# Patient Record
Sex: Female | Born: 1970
Health system: Southern US, Community
[De-identification: ages and names within clinical notes are randomized; demographics above are authoritative.]

## PROBLEM LIST (undated history)

## (undated) DIAGNOSIS — E782 Mixed hyperlipidemia: Secondary | ICD-10-CM

## (undated) DIAGNOSIS — I10 Essential (primary) hypertension: Secondary | ICD-10-CM

## (undated) DIAGNOSIS — G4700 Insomnia, unspecified: Secondary | ICD-10-CM

## (undated) DIAGNOSIS — S42301A Unspecified fracture of shaft of humerus, right arm, initial encounter for closed fracture: Secondary | ICD-10-CM

## (undated) DIAGNOSIS — Z87440 Personal history of urinary (tract) infections: Secondary | ICD-10-CM

## (undated) DIAGNOSIS — K219 Gastro-esophageal reflux disease without esophagitis: Secondary | ICD-10-CM

## (undated) DIAGNOSIS — D518 Other vitamin B12 deficiency anemias: Secondary | ICD-10-CM

## (undated) DIAGNOSIS — F5102 Adjustment insomnia: Secondary | ICD-10-CM

## (undated) DIAGNOSIS — R7301 Impaired fasting glucose: Secondary | ICD-10-CM

## (undated) DIAGNOSIS — E669 Obesity, unspecified: Secondary | ICD-10-CM

## (undated) DIAGNOSIS — Z8719 Personal history of other diseases of the digestive system: Secondary | ICD-10-CM

## (undated) DIAGNOSIS — E559 Vitamin D deficiency, unspecified: Secondary | ICD-10-CM

## (undated) DIAGNOSIS — R32 Unspecified urinary incontinence: Secondary | ICD-10-CM

## (undated) DIAGNOSIS — M722 Plantar fascial fibromatosis: Secondary | ICD-10-CM

## (undated) DIAGNOSIS — E119 Type 2 diabetes mellitus without complications: Secondary | ICD-10-CM

## (undated) DIAGNOSIS — E781 Pure hyperglyceridemia: Secondary | ICD-10-CM

## (undated) HISTORY — DX: Mixed hyperlipidemia: E78.2

## (undated) HISTORY — DX: Type 2 diabetes mellitus without complications: E11.9

## (undated) HISTORY — PX: ABDOMINAL HYSTERECTOMY: SHX81

## (undated) HISTORY — PX: OTHER SURGICAL HISTORY: SHX169

## (undated) HISTORY — DX: Adjustment insomnia: F51.02

## (undated) HISTORY — PX: CHOLECYSTECTOMY: SHX55

---

## 2015-01-26 ENCOUNTER — Ambulatory Visit (INDEPENDENT_AMBULATORY_CARE_PROVIDER_SITE_OTHER): Payer: BLUE CROSS/BLUE SHIELD | Admitting: Podiatry

## 2015-01-26 ENCOUNTER — Encounter: Payer: Self-pay | Admitting: Podiatry

## 2015-01-26 VITALS — BP 105/75 | HR 83 | Temp 98.7°F | Resp 14

## 2015-01-26 DIAGNOSIS — L03031 Cellulitis of right toe: Secondary | ICD-10-CM

## 2015-01-26 DIAGNOSIS — B353 Tinea pedis: Secondary | ICD-10-CM

## 2015-01-26 MED ORDER — CLOTRIMAZOLE-BETAMETHASONE 1-0.05 % EX CREA: 1.0000 "application " | TOPICAL_CREAM | Freq: Two times a day (BID) | CUTANEOUS | Status: DC

## 2015-01-26 NOTE — Progress Notes (Signed)
   Subjective:    Patient ID: Madison Carey, female    DOB: Jan 12, 1971, 44 y.o.   MRN: [id number]  HPI Patient presents here today with  Right great toe pain for about 3 mos. She has tried soaking and had a nail salon to try to dig it out Also she has a left great toe black spot and a B/L bottom offeet rash, has gone to a Dermatologist and was diagnosed with some type of skin disease. This patient presents to the office with severe pain x 3 months along the inside border big toe right.  She says her salon and dermatologist have attempted to relieve her pain with no avail.  She says her sheets at home have caused severe pain in bed.  She also has skin rash on both feet on bottom of both feet including bottom of right big toe.  Review of Systems  All other systems reviewed and are negative.      Objective:   Physical Exam GENERAL APPEARANCE: Alert, conversant. Appropriately groomed. No acute distress.  VASCULAR: Pedal pulses palpable at  [facility] and PT bilateral.  Capillary refill time is immediate to all digits,  Normal temperature gradient.  Digital hair growth is present bilateral  NEUROLOGIC: sensation is normal to 5.07 monofilament at 5/5 sites bilateral.  Light touch is intact bilateral, Muscle strength normal.  MUSCULOSKELETAL: acceptable muscle strength, tone and stability bilateral.  Intrinsic muscluature intact bilateral.  Rectus appearance of foot and digits noted bilateral.   DERMATOLOGIC: skin color, texture, and turgor are within normal limits.  No preulcerative lesions or ulcers  are seen, no interdigital maceration noted.  No open lesions present.  . No drainage noted. Red inflamed patches of skin bottom of both feet with signs of blister noted. NAILS  Pain upon distal pressure medial border right great toe. No signs of redness or infection noted.          Assessment & Plan:  Paronychia medial border right hallux.  Tinea pedis B/L  IE  I & D paronychia medial border right  hallux.  Prescribed lotrisone for athletes foot.

## 2015-01-26 NOTE — Addendum Note (Signed)
Addended by: Ezzard Flax, Holley Wirt L on: 01/26/2015 03:32 PM   Modules accepted: Orders, Medications

## 2015-01-29 ENCOUNTER — Ambulatory Visit: Payer: BLUE CROSS/BLUE SHIELD | Admitting: Podiatry

## 2015-12-30 ENCOUNTER — Other Ambulatory Visit: Payer: Self-pay | Admitting: Urology

## 2016-02-11 ENCOUNTER — Encounter (HOSPITAL_COMMUNITY): Payer: Self-pay | Admitting: *Deleted

## 2016-02-11 ENCOUNTER — Encounter (HOSPITAL_COMMUNITY)
Admission: RE | Admit: 2016-02-11 | Discharge: 2016-02-11 | Disposition: A | Discharge status: Home / Self Care | Payer: BLUE CROSS/BLUE SHIELD | Source: Ambulatory Visit | Attending: Urology | Admitting: Urology

## 2016-02-11 DIAGNOSIS — T83722A Exposure of implanted urethral mesh into urethra, initial encounter: Secondary | ICD-10-CM | POA: Diagnosis not present

## 2016-02-11 DIAGNOSIS — X58XXXA Exposure to other specified factors, initial encounter: Secondary | ICD-10-CM | POA: Insufficient documentation

## 2016-02-11 DIAGNOSIS — Z01812 Encounter for preprocedural laboratory examination: Secondary | ICD-10-CM | POA: Diagnosis not present

## 2016-02-11 HISTORY — DX: Other vitamin B12 deficiency anemias: D51.8

## 2016-02-11 HISTORY — DX: Unspecified fracture of shaft of humerus, right arm, initial encounter for closed fracture: S42.301A

## 2016-02-11 HISTORY — DX: Gastro-esophageal reflux disease without esophagitis: K21.9

## 2016-02-11 HISTORY — DX: Personal history of urinary (tract) infections: Z87.440

## 2016-02-11 HISTORY — DX: Essential (primary) hypertension: I10

## 2016-02-11 HISTORY — DX: Plantar fascial fibromatosis: M72.2

## 2016-02-11 HISTORY — DX: Insomnia, unspecified: G47.00

## 2016-02-11 HISTORY — DX: Impaired fasting glucose: R73.01

## 2016-02-11 HISTORY — DX: Unspecified urinary incontinence: R32

## 2016-02-11 HISTORY — DX: Vitamin D deficiency, unspecified: E55.9

## 2016-02-11 HISTORY — DX: Pure hyperglyceridemia: E78.1

## 2016-02-11 HISTORY — DX: Obesity, unspecified: E66.9

## 2016-02-11 HISTORY — DX: Personal history of other diseases of the digestive system: Z87.19

## 2016-02-11 LAB — CBC
HCT: 35.2 % — ABNORMAL LOW (ref 36.0–46.0)
Hemoglobin: 12.4 g/dL (ref 12.0–15.0)
MCH: 31.9 pg (ref 26.0–34.0)
MCHC: 35.2 g/dL (ref 30.0–36.0)
MCV: 90.5 fL (ref 78.0–100.0)
PLATELETS: 275 10*3/uL (ref 150–400)
RBC: 3.89 MIL/uL (ref 3.87–5.11)
RDW: 14.6 % (ref 11.5–15.5)
WBC: 6 10*3/uL (ref 4.0–10.5)

## 2016-02-11 LAB — BASIC METABOLIC PANEL
Anion gap: 7 (ref 5–15)
BUN: 10 mg/dL (ref 6–20)
CALCIUM: 9.6 mg/dL (ref 8.9–10.3)
CO2: 24 mmol/L (ref 22–32)
CREATININE: 0.67 mg/dL (ref 0.44–1.00)
Chloride: 109 mmol/L (ref 101–111)
GLUCOSE: 102 mg/dL — AB (ref 65–99)
Potassium: 3.9 mmol/L (ref 3.5–5.1)
Sodium: 140 mmol/L (ref 135–145)

## 2016-02-11 NOTE — Patient Instructions (Signed)
Madison Carey  02/11/2016   Your procedure is scheduled on: Tuesday February 16, 2016  Report to [facility] Main  Entrance take [place]  elevators to 3rd floor to  [place] at 5:30 AM.  Call this number if you have problems the morning of surgery [phone]   Remember: ONLY 1 PERSON MAY GO WITH YOU TO SHORT STAY TO GET  READY MORNING OF [name].  Do not eat food or drink liquids :After Midnight.     Take these medicines the morning of surgery with A SIP OF WATER: Omeprazole;                                 You may not have any metal on your body including hair pins and              piercings  Do not wear jewelry, make-up, lotions, powders or perfumes, deodorant             Do not wear nail polish.  Do not shave  48 hours prior to surgery.     Do not bring valuables to the hospital. [place].  Contacts, dentures or bridgework may not be worn into surgery.  Leave suitcase in the car. After surgery it may be brought to your room. ____________________________________________________________________             [facility] - Preparing for Surgery Before surgery, you can play an important role.  Because skin is not sterile, your skin needs to be as free of germs as possible.  You can reduce the number of germs on your skin by washing with CHG (chlorahexidine gluconate) soap before surgery.  CHG is an antiseptic cleaner which kills germs and bonds with the skin to continue killing germs even after washing. Please DO NOT use if you have an allergy to CHG or antibacterial soaps.  If your skin becomes reddened/irritated stop using the CHG and inform your nurse when you arrive at Short Stay. Do not shave (including legs and underarms) for at least 48 hours prior to the first CHG shower.  You may shave your face/neck. Please follow these instructions carefully:  1.  Shower with CHG Soap the night before  surgery and the  morning of Surgery.  2.  If you choose to wash your hair, wash your hair first as usual with your  normal  shampoo.  3.  After you shampoo, rinse your hair and body thoroughly to remove the  shampoo.                           4.  Use CHG as you would any other liquid soap.  You can apply chg directly  to the skin and wash                       Gently with a scrungie or clean washcloth.  5.  Apply the CHG Soap to your body ONLY FROM THE NECK DOWN.   Do not use on face/ open  Wound or open sores. Avoid contact with eyes, ears mouth and genitals (private parts).                       Wash face,  Genitals (private parts) with your normal soap.             6.  Wash thoroughly, paying special attention to the area where your surgery  will be performed.  7.  Thoroughly rinse your body with warm water from the neck down.  8.  DO NOT shower/wash with your normal soap after using and rinsing off  the CHG Soap.                9.  Pat yourself dry with a clean towel.            10.  Wear clean pajamas.            11.  Place clean sheets on your bed the night of your first shower and do not  sleep with pets. Day of Surgery : Do not apply any lotions/deodorants the morning of surgery.  Please wear clean clothes to the hospital/surgery center.  FAILURE TO FOLLOW THESE INSTRUCTIONS MAY RESULT IN THE CANCELLATION OF YOUR SURGERY PATIENT SIGNATURE_________________________________  NURSE SIGNATURE__________________________________  ________________________________________________________________________

## 2016-02-11 NOTE — Progress Notes (Addendum)
EKG per chart 07/29/2015 OV note per chart 01/05/2016 per Valaria Good PA A1C results per chart 01/05/2016

## 2016-02-15 MED ORDER — GENTAMICIN SULFATE 40 MG/ML IJ SOLN
320.0000 mg | INTRAVENOUS | Status: AC
  Administered 2016-02-16: 320 mg via INTRAVENOUS
  Filled 2016-02-15: qty 8

## 2016-02-15 NOTE — H&P (Signed)
the patient was referred for vaginal bleeding that started approximate 4 months ago. It to daily problem requiring approximately 2 pads a day.   The patient had a hysterectomy in 2008 and a bladder suspension noted. She has been dry since. The medical record in the past noted an urgent bladder but currently she denies incontinence but notes that sometimes it may be difficult to tell urine from blood   She voids every 2-3 hours and gets up once a night reports a good flow   She denies a history kidney stones and had a significant bladder infection taking her to the hospital approximate 4 months ago   She has no neurologic risk factors her symptoms. She had a bladder suspension with hysterectomy. Her bowel function is normal   I reviewed the medical records and she had a TVT O and has been on Vesicare in the past. On the pelvic examination there was a small amount of blood on the left side of the vagina near the sling but because of folds and rugae extrusion was not definitively noted. I noted a negative cystoscopy May 2017 by her urologist     ALLERGIES: None   MEDICATIONS: None   GU PSH: None   NON-GU PSH: None   GU PMH: None   NON-GU PMH: Encounter for general adult medical examination without abnormal findings, Encounter for preventive health examination    FAMILY HISTORY: None   SOCIAL HISTORY: Marital Status: Married    REVIEW OF SYSTEMS:    GU Review Female:   Patient reports leakage of urine. Patient denies frequent urination, hard to postpone urination, burning/ pain with urination, get up at night to urinate, stream starts and stops, trouble starting your stream, have to strain to urinate , erection problems, and penile pain.  Gastrointestinal (Upper):   Patient denies nausea, vomiting, and indigestion/ heartburn.  Gastrointestinal (Lower):   Patient denies diarrhea and constipation.  Constitutional:   Patient denies fever, night sweats, weight loss, and fatigue.  Skin:    Patient denies skin rash/ lesion and itching.  Eyes:   Patient denies double vision and blurred vision.  Ears/ Nose/ Throat:   Patient denies sore throat and sinus problems.  Hematologic/Lymphatic:   Patient denies swollen glands and easy bruising.  Cardiovascular:   Patient denies leg swelling and chest pains.  Respiratory:   Patient denies cough and shortness of breath.  Endocrine:   Patient denies excessive thirst.  Musculoskeletal:   Patient denies back pain and joint pain.  Neurological:   Patient denies headaches and dizziness.  Psychologic:   Patient denies depression and anxiety.   Notes: blood in urine  vaginal bleeding   VITAL SIGNS:      12/03/2015 02:57 PM  Weight 196 lb / 88.9 kg  Height 62 in / 157.48 cm  Temperature 98.0 F / 37 C  BMI 35.8 kg/m   GU PHYSICAL EXAMINATION:    Vagina: n pelvic examination the patient had a well supported bladder neck with a little bit of urethral shortening. She had no significant prolapse. I did not see any blood in the vagina. At the vaginal cuff there was mild erythema but I thought it was within normal limits. She did have mucosal folds especially at the left urethrovesical angle. I use a single and double speculum and I could not see any extrusion or blood. She was a bit tender but was very compliant with the lengthy examination. Her tissues did not look particularly dry or atrophied.  MULTI-SYSTEM PHYSICAL EXAMINATION:    Constitutional: Well-nourished. No physical deformities. Normally developed. Good grooming.  Neck: Neck symmetrical, not swollen. Normal tracheal position.  Respiratory: No labored breathing, no use of accessory muscles.   Cardiovascular: Normal temperature, normal extremity pulses, no swelling, no varicosities.  Lymphatic: No enlargement of neck, axillae, groin.  Skin: No paleness, no jaundice, no cyanosis. No lesion, no ulcer, no rash.  Neurologic / Psychiatric: Oriented to time, oriented to place, oriented to  person. No depression, no anxiety, no agitation.  Gastrointestinal: No mass, no tenderness, no rigidity, mild obesity  Eyes: Normal conjunctivae. Normal eyelids.  Ears, Nose, Mouth, and Throat: Left ear no scars, no lesions, no masses. Right ear no scars, no lesions, no masses. Nose no scars, no lesions, no masses. Normal hearing. Normal lips.  Musculoskeletal: Normal gait and station of head and neck.     PAST DATA REVIEWED:  Source Of History:  Patient   PROCEDURES:           PVR Ultrasound - [id number]  Scanned Volume: 0 cc         Urinalysis w/Scope - 81001 Dipstick Dipstick Cont'd Micro  Specimen: Voided Blood: Neg WBC/hpf: 6-10/hpf  Color: Yellow Nitrites: Neg RBC/hpf: 3-10/hpf  Appearance: Clear Leukocyte Esterase: 1+ Bacteria: Few (10-25/hpf)    ASSESSMENT:      ICD-10 Details  1 GU:   Nocturia - R35.1   2 NON-GU:   Other specified dyspareunia - N94.19           Notes:   the patient was referred for vaginal bleeding that started approximate 4 months ago. It to daily problem requiring approximately 2 pads a day.   The patient had a hysterectomy in 2008 and a bladder suspension noted. She has been dry since. The medical record in the past noted an urgent bladder but currently she denies incontinence but notes that sometimes it may be difficult to tell urine from blood   the blood is described as several centimeters by a few centimeters and can reach through a pad. There are no clots   She does have dyspareunia and worse bleeding after intercourse.   She voids every 2-3 hours and gets up once a night reports a good flow   as part of my examination I recommended cystoscopy to rule out occult abnormalities in the bladder and urethra. The patient did not want this since she reports a normal examination by one of the urologist in [place]. She noted she did not want an unnecessary test in spite of my recommendations.   On pelvic examination the patient had a well supported bladder  neck with a little bit of urethral shortening. She had no significant prolapse. I did not see any blood in the vagina. At the vaginal cuff there was mild erythema but I thought it was within normal limits. She did have mucosal folds especially at the left urethrovesical angle. I use a single and double speculum and I could not see any extrusion or blood. She was a bit tender but was very compliant with the lengthy examination. Her tissues did not look particularly dry or atrophied.   this is a second examination but no extrusion has been identified. I would not be surprised if she does have an area of extrusion and identified today. Role of examination under anesthesia discussed. She also understands that I would not recommend sling removal without a firm diagnosis and without performing cystoscopy prior even though she was cleared by another urologist  picture was drawn. Patient consented to an examination under anesthesia with cystoscopy. I would not perform definitive surgery at that time and she understands. Pros cons and risks described.   she would need extensive counseling and appropriate expectations if she ever had her sling removed. Worsening incontinence and ongoing or worsening pain syndromes would need to be discussed.   I drew a picture for the patient. I reviewed the role of estrogen cream on a regular basis and reevaluate in 8 weeks. I spoke about watchful waiting. I discussed a revision and removal of the vaginal component of the sling under anesthesia. Depending upon local intraoperative findings leaving sling especially on the patient's right side could help salvage her continence.   I drew her a picture and we talked about reconstructive surgery in detail. Pros, cons, general surgical and anesthetic risks, and other options including watchful waiting were discussed. She understands that surgery is successful in approximately 85% of cases. Failure rates and the risk of  persistence/recurrence/and worsening of the problem were discussed. Surgical risks were described but not limited to the discussion of injury to neighboring structures including the bowel (with possible life-threatening sepsis and colostomy), bladder, urethra, vagina (all resulting in further surgery), and ureter (resulting in re-implantation). We talked about injury to nerves/soft tissue leading to debilitating and intractable pelvic, abdominal, and lower extremity pain syndromes and neuropathies. The risks of dyspareunia, vaginal narrowing and shortening with sequelae were discussed. The risk of persistent, de novo, or worsening incontinence/dysfunction was discussed. Bleeding risks, transfusion rates, and infection were discussed. The usual post-operative course was described. The patient understands that she might not reach her treatment goal and that she might be worse following surgery.   I stressed to the patient that her incontinence could certainly worsen and it is very likely she could still have vaginal pain and dyspareunia. In spite of removing foreign body and performing a multilayer closure recurrence rates were also discussed.   she did not want to try estrogen and she understands the difference between a sinusin versus superficial extrusion with the picture. I felt that the chance of her incontinence is worsening reached 25-40% or higher. She was wearing 2-3 pads per day in 2008 and she leaked quite a bit with coughing running and she said that she could not do any activities. I talked her about the high rate of persistent pain or worsening of pain syndromes as well.   After a thorough review of the management options for the patient's condition the patient  elected to proceed with surgical therapy as noted above. We have discussed the potential benefits and risks of the procedure, side effects of the proposed treatment, the likelihood of the patient achieving the goals of the procedure, and  any potential problems that might occur during the procedure or recuperation. Informed consent has been obtained.

## 2016-02-15 NOTE — Anesthesia Preprocedure Evaluation (Addendum)
Anesthesia Evaluation  Patient identified by MRN, date of birth, ID band Patient awake    Reviewed: Allergy & Precautions, H&P , NPO status , Patient's Chart, lab work & pertinent test results  History of Anesthesia Complications Negative for: history of anesthetic complications  Airway Mallampati: II  TM Distance: >3 FB Neck ROM: full    Dental no notable dental hx.    Pulmonary former smoker,    Pulmonary exam normal breath sounds clear to auscultation       Cardiovascular hypertension, Normal cardiovascular exam Rhythm:regular Rate:Normal     Neuro/Psych negative neurological ROS     GI/Hepatic Neg liver ROS, hiatal hernia, GERD  ,  Endo/Other  negative endocrine ROS  Renal/GU negative Renal ROS     Musculoskeletal   Abdominal   Peds  Hematology negative hematology ROS (+)   Anesthesia Other Findings High anxiety preop  Reproductive/Obstetrics negative OB ROS                            Anesthesia Physical Anesthesia Plan  ASA: II  Anesthesia Plan: General   Post-op Pain Management:    Induction: Intravenous  Airway Management Planned: Oral ETT  Additional Equipment:   Intra-op Plan:   Post-operative Plan: Extubation in OR  Informed Consent: I have reviewed the patients History and Physical, chart, labs and discussed the procedure including the risks, benefits and alternatives for the proposed anesthesia with the patient or authorized representative who has indicated his/her understanding and acceptance.   Dental Advisory Given  Plan Discussed with: Anesthesiologist, CRNA and Surgeon  Anesthesia Plan Comments: (Will provide ETT and paralysis to optimize surgical conditions)       Anesthesia Quick Evaluation

## 2016-02-16 ENCOUNTER — Ambulatory Visit (HOSPITAL_COMMUNITY): Payer: BLUE CROSS/BLUE SHIELD | Admitting: Anesthesiology

## 2016-02-16 ENCOUNTER — Observation Stay (HOSPITAL_COMMUNITY)
Admission: RE | Admit: 2016-02-16 | Discharge: 2016-02-17 | Disposition: A | Discharge status: Home / Self Care | Payer: BLUE CROSS/BLUE SHIELD | Source: Ambulatory Visit | Attending: Urology | Admitting: Urology

## 2016-02-16 ENCOUNTER — Encounter (HOSPITAL_COMMUNITY): Payer: Self-pay

## 2016-02-16 ENCOUNTER — Encounter (HOSPITAL_COMMUNITY)
Admission: RE | Disposition: A | Discharge status: Home / Self Care | Payer: Self-pay | Source: Ambulatory Visit | Attending: Urology

## 2016-02-16 DIAGNOSIS — Z90710 Acquired absence of both cervix and uterus: Secondary | ICD-10-CM | POA: Diagnosis not present

## 2016-02-16 DIAGNOSIS — T83711A Erosion of implanted vaginal mesh and other prosthetic materials to surrounding organ or tissue, initial encounter: Secondary | ICD-10-CM | POA: Diagnosis not present

## 2016-02-16 DIAGNOSIS — R51 Headache: Secondary | ICD-10-CM | POA: Diagnosis not present

## 2016-02-16 DIAGNOSIS — I10 Essential (primary) hypertension: Secondary | ICD-10-CM | POA: Diagnosis not present

## 2016-02-16 DIAGNOSIS — R351 Nocturia: Secondary | ICD-10-CM | POA: Diagnosis not present

## 2016-02-16 DIAGNOSIS — N941 Unspecified dyspareunia: Secondary | ICD-10-CM | POA: Diagnosis not present

## 2016-02-16 DIAGNOSIS — Z79899 Other long term (current) drug therapy: Secondary | ICD-10-CM | POA: Insufficient documentation

## 2016-02-16 DIAGNOSIS — Z87891 Personal history of nicotine dependence: Secondary | ICD-10-CM | POA: Diagnosis not present

## 2016-02-16 DIAGNOSIS — K219 Gastro-esophageal reflux disease without esophagitis: Secondary | ICD-10-CM | POA: Diagnosis not present

## 2016-02-16 DIAGNOSIS — Y831 Surgical operation with implant of artificial internal device as the cause of abnormal reaction of the patient, or of later complication, without mention of misadventure at the time of the procedure: Secondary | ICD-10-CM | POA: Insufficient documentation

## 2016-02-16 DIAGNOSIS — R102 Pelvic and perineal pain: Secondary | ICD-10-CM | POA: Insufficient documentation

## 2016-02-16 DIAGNOSIS — T83712A Erosion of implanted urethral mesh to surrounding organ or tissue, initial encounter: Secondary | ICD-10-CM | POA: Diagnosis present

## 2016-02-16 HISTORY — PX: CYSTOSCOPY: SHX5120

## 2016-02-16 HISTORY — PX: REVISION URINARY SLING: SHX6213

## 2016-02-16 LAB — HEMOGLOBIN AND HEMATOCRIT, BLOOD
HCT: 34.2 % — ABNORMAL LOW (ref 36.0–46.0)
Hemoglobin: 11.9 g/dL — ABNORMAL LOW (ref 12.0–15.0)

## 2016-02-16 SURGERY — REVISION, SLING OPERATION, FOR STRESS INCONTINENCE
Anesthesia: General

## 2016-02-16 MED ORDER — ROCURONIUM BROMIDE 10 MG/ML (PF) SYRINGE
PREFILLED_SYRINGE | INTRAVENOUS | Status: DC | PRN
  Administered 2016-02-16: 40 mg via INTRAVENOUS
  Administered 2016-02-16 (×2): 10 mg via INTRAVENOUS

## 2016-02-16 MED ORDER — DEXAMETHASONE SODIUM PHOSPHATE 10 MG/ML IJ SOLN
INTRAMUSCULAR | Status: AC
  Filled 2016-02-16: qty 1

## 2016-02-16 MED ORDER — PHENYLEPHRINE 40 MCG/ML (10ML) SYRINGE FOR IV PUSH (FOR BLOOD PRESSURE SUPPORT)
PREFILLED_SYRINGE | INTRAVENOUS | Status: DC | PRN
  Administered 2016-02-16: 40 ug via INTRAVENOUS

## 2016-02-16 MED ORDER — DIPHENHYDRAMINE HCL 12.5 MG/5ML PO ELIX: 12.5000 mg | ORAL_SOLUTION | Freq: Four times a day (QID) | ORAL | Status: DC | PRN

## 2016-02-16 MED ORDER — SUCCINYLCHOLINE CHLORIDE 200 MG/10ML IV SOSY
PREFILLED_SYRINGE | INTRAVENOUS | Status: DC | PRN
  Administered 2016-02-16: 140 mg via INTRAVENOUS

## 2016-02-16 MED ORDER — ONDANSETRON HCL 4 MG/2ML IJ SOLN
INTRAMUSCULAR | Status: AC
  Filled 2016-02-16: qty 2

## 2016-02-16 MED ORDER — DIPHENHYDRAMINE HCL 50 MG/ML IJ SOLN: 12.5000 mg | Freq: Four times a day (QID) | INTRAMUSCULAR | Status: DC | PRN

## 2016-02-16 MED ORDER — AMPICILLIN-SULBACTAM SODIUM 1.5 (1-0.5) G IJ SOLR
INTRAMUSCULAR | Status: AC
  Filled 2016-02-16: qty 1.5

## 2016-02-16 MED ORDER — LIDOCAINE-EPINEPHRINE (PF) 1 %-1:200000 IJ SOLN
INTRAMUSCULAR | Status: AC
  Filled 2016-02-16: qty 30

## 2016-02-16 MED ORDER — FENTANYL CITRATE (PF) 250 MCG/5ML IJ SOLN
INTRAMUSCULAR | Status: AC
  Filled 2016-02-16: qty 5

## 2016-02-16 MED ORDER — PROMETHAZINE HCL 25 MG/ML IJ SOLN: 6.2500 mg | INTRAMUSCULAR | Status: DC | PRN

## 2016-02-16 MED ORDER — SODIUM CHLORIDE 0.9 % IR SOLN
Status: AC
  Filled 2016-02-16: qty 1

## 2016-02-16 MED ORDER — ESTRADIOL 0.1 MG/GM VA CREA
TOPICAL_CREAM | VAGINAL | Status: AC
  Filled 2016-02-16: qty 42.5

## 2016-02-16 MED ORDER — SUGAMMADEX SODIUM 200 MG/2ML IV SOLN
INTRAVENOUS | Status: AC
Start: 2016-02-16 — End: 2016-02-16
  Filled 2016-02-16: qty 2

## 2016-02-16 MED ORDER — SODIUM CHLORIDE 0.9 % IR SOLN
Status: DC | PRN
  Administered 2016-02-16: 500 mL

## 2016-02-16 MED ORDER — FENTANYL CITRATE (PF) 100 MCG/2ML IJ SOLN
INTRAMUSCULAR | Status: AC
  Administered 2016-02-16: 25 ug via INTRAVENOUS
  Filled 2016-02-16: qty 2

## 2016-02-16 MED ORDER — SUGAMMADEX SODIUM 200 MG/2ML IV SOLN
INTRAVENOUS | Status: DC | PRN
  Administered 2016-02-16: 180 mg via INTRAVENOUS

## 2016-02-16 MED ORDER — ACETAMINOPHEN 325 MG PO TABS
650.0000 mg | ORAL_TABLET | ORAL | Status: DC | PRN
  Administered 2016-02-16: 650 mg via ORAL
  Filled 2016-02-16: qty 2

## 2016-02-16 MED ORDER — ZOLPIDEM TARTRATE 5 MG PO TABS
5.0000 mg | ORAL_TABLET | Freq: Every evening | ORAL | Status: AC | PRN
  Administered 2016-02-16: 5 mg via ORAL
  Filled 2016-02-16 (×2): qty 1

## 2016-02-16 MED ORDER — DEXTROSE-NACL 5-0.45 % IV SOLN
INTRAVENOUS | Status: DC
  Administered 2016-02-16 – 2016-02-17 (×2): via INTRAVENOUS

## 2016-02-16 MED ORDER — PANTOPRAZOLE SODIUM 40 MG PO TBEC
80.0000 mg | DELAYED_RELEASE_TABLET | Freq: Every day | ORAL | Status: DC
  Administered 2016-02-16 – 2016-02-17 (×2): 80 mg via ORAL
  Filled 2016-02-16 (×2): qty 2

## 2016-02-16 MED ORDER — HYDROCODONE-ACETAMINOPHEN 5-325 MG PO TABS: 1.0000 | ORAL_TABLET | Freq: Four times a day (QID) | ORAL | 0 refills | Status: DC | PRN

## 2016-02-16 MED ORDER — DEXAMETHASONE SODIUM PHOSPHATE 10 MG/ML IJ SOLN
INTRAMUSCULAR | Status: DC | PRN
  Administered 2016-02-16: 10 mg via INTRAVENOUS

## 2016-02-16 MED ORDER — ROCURONIUM BROMIDE 10 MG/ML (PF) SYRINGE
PREFILLED_SYRINGE | INTRAVENOUS | Status: AC
  Filled 2016-02-16: qty 10

## 2016-02-16 MED ORDER — LACTATED RINGERS IV SOLN
INTRAVENOUS | Status: DC | PRN
  Administered 2016-02-16: 07:00:00 via INTRAVENOUS

## 2016-02-16 MED ORDER — PROPOFOL 10 MG/ML IV BOLUS
INTRAVENOUS | Status: DC | PRN
  Administered 2016-02-16: 150 mg via INTRAVENOUS

## 2016-02-16 MED ORDER — PROPOFOL 10 MG/ML IV BOLUS
INTRAVENOUS | Status: AC
  Filled 2016-02-16: qty 20

## 2016-02-16 MED ORDER — LIDOCAINE 2% (20 MG/ML) 5 ML SYRINGE
INTRAMUSCULAR | Status: DC | PRN
Start: 2016-02-16 — End: 2016-02-16
  Administered 2016-02-16: 50 mg via INTRAVENOUS

## 2016-02-16 MED ORDER — LIDOCAINE 2% (20 MG/ML) 5 ML SYRINGE
INTRAMUSCULAR | Status: AC
  Filled 2016-02-16: qty 5

## 2016-02-16 MED ORDER — STERILE WATER FOR IRRIGATION IR SOLN
Status: DC | PRN
  Administered 2016-02-16: 3000 mL

## 2016-02-16 MED ORDER — ONDANSETRON HCL 4 MG/2ML IJ SOLN
INTRAMUSCULAR | Status: DC | PRN
  Administered 2016-02-16: 4 mg via INTRAVENOUS

## 2016-02-16 MED ORDER — SODIUM CHLORIDE 0.9 % IV SOLN
1.5000 g | INTRAVENOUS | Status: AC
  Administered 2016-02-16: 1.5 g via INTRAVENOUS
  Filled 2016-02-16: qty 1.5

## 2016-02-16 MED ORDER — ATORVASTATIN CALCIUM 10 MG PO TABS
10.0000 mg | ORAL_TABLET | Freq: Every day | ORAL | Status: DC
  Administered 2016-02-16 – 2016-02-17 (×2): 10 mg via ORAL
  Filled 2016-02-16 (×2): qty 1

## 2016-02-16 MED ORDER — ONDANSETRON HCL 4 MG/2ML IJ SOLN
4.0000 mg | INTRAMUSCULAR | Status: DC | PRN
  Administered 2016-02-16 (×2): 4 mg via INTRAVENOUS
  Filled 2016-02-16 (×2): qty 2

## 2016-02-16 MED ORDER — FENTANYL CITRATE (PF) 100 MCG/2ML IJ SOLN
INTRAMUSCULAR | Status: DC | PRN
  Administered 2016-02-16 (×3): 50 ug via INTRAVENOUS
  Administered 2016-02-16: 100 ug via INTRAVENOUS
  Administered 2016-02-16 (×2): 50 ug via INTRAVENOUS
  Administered 2016-02-16: 100 ug via INTRAVENOUS
  Administered 2016-02-16: 50 ug via INTRAVENOUS

## 2016-02-16 MED ORDER — PHENYLEPHRINE 40 MCG/ML (10ML) SYRINGE FOR IV PUSH (FOR BLOOD PRESSURE SUPPORT)
PREFILLED_SYRINGE | INTRAVENOUS | Status: AC
  Filled 2016-02-16: qty 10

## 2016-02-16 MED ORDER — HYDROCODONE-ACETAMINOPHEN 5-325 MG PO TABS
1.0000 | ORAL_TABLET | ORAL | Status: DC | PRN
  Administered 2016-02-16 – 2016-02-17 (×3): 1 via ORAL
  Administered 2016-02-17: 2 via ORAL
  Filled 2016-02-16 (×2): qty 1
  Filled 2016-02-16 (×2): qty 2
  Filled 2016-02-16: qty 1

## 2016-02-16 MED ORDER — FENTANYL CITRATE (PF) 100 MCG/2ML IJ SOLN
25.0000 ug | INTRAMUSCULAR | Status: DC | PRN
  Administered 2016-02-16: 50 ug via INTRAVENOUS
  Administered 2016-02-16: 25 ug via INTRAVENOUS

## 2016-02-16 MED ORDER — SODIUM CHLORIDE 0.9 % IV SOLN
INTRAVENOUS | Status: AC
  Filled 2016-02-16: qty 50

## 2016-02-16 MED ORDER — MIDAZOLAM HCL 2 MG/2ML IJ SOLN
INTRAMUSCULAR | Status: AC
  Filled 2016-02-16: qty 2

## 2016-02-16 MED ORDER — MORPHINE SULFATE (PF) 2 MG/ML IV SOLN
2.0000 mg | INTRAVENOUS | Status: DC | PRN
  Administered 2016-02-16: 2 mg via INTRAVENOUS
  Administered 2016-02-16: 4 mg via INTRAVENOUS
  Filled 2016-02-16: qty 1
  Filled 2016-02-16: qty 2
  Filled 2016-02-16: qty 1

## 2016-02-16 SURGICAL SUPPLY — 48 items
BAG URINE DRAINAGE (UROLOGICAL SUPPLIES) ×2 IMPLANT
BAG URO CATCHER STRL LF (MISCELLANEOUS) IMPLANT
BLADE HEX COATED 2.75 (ELECTRODE) ×2 IMPLANT
BLADE SURG 15 STRL LF DISP TIS (BLADE) ×1 IMPLANT
BLADE SURG 15 STRL SS (BLADE) ×1
CATH FOLEY 2WAY SLVR  5CC 14FR (CATHETERS) ×1
CATH FOLEY 2WAY SLVR 5CC 14FR (CATHETERS) ×1 IMPLANT
CLOTH BEACON ORANGE TIMEOUT ST (SAFETY) ×2 IMPLANT
COVER MAYO STAND STRL (DRAPES) ×2 IMPLANT
DECANTER SPIKE VIAL GLASS SM (MISCELLANEOUS) IMPLANT
DRAIN PENROSE 18X1/4 LTX STRL (WOUND CARE) ×2 IMPLANT
DRAPE SHEET LG 3/4 BI-LAMINATE (DRAPES) ×2 IMPLANT
ELECT PENCIL ROCKER SW 15FT (MISCELLANEOUS) ×2 IMPLANT
GAUZE PACKING 2X5 YD STRL (GAUZE/BANDAGES/DRESSINGS) ×2 IMPLANT
GAUZE SPONGE 4X4 16PLY XRAY LF (GAUZE/BANDAGES/DRESSINGS) ×4 IMPLANT
GLOVE BIOGEL M STRL SZ7.5 (GLOVE) ×6 IMPLANT
GLOVE ECLIPSE 8.0 STRL XLNG CF (GLOVE) IMPLANT
GOWN STRL REUS W/TWL XL LVL3 (GOWN DISPOSABLE) ×2 IMPLANT
HEMOSTAT SURGICEL 4X8 (HEMOSTASIS) ×2 IMPLANT
HOLDER FOLEY CATH W/STRAP (MISCELLANEOUS) ×2 IMPLANT
KIT BASIN OR (CUSTOM PROCEDURE TRAY) ×2 IMPLANT
LIQUID BAND (GAUZE/BANDAGES/DRESSINGS) IMPLANT
NEEDLE HYPO 22GX1.5 SAFETY (NEEDLE) ×2 IMPLANT
NEEDLE MAYO 6 CRC TAPER PT (NEEDLE) IMPLANT
PACK CYSTO (CUSTOM PROCEDURE TRAY) ×2 IMPLANT
PLUG CATH AND CAP STER (CATHETERS) ×2 IMPLANT
RETRACTOR STAY HOOK 5MM (MISCELLANEOUS) ×2 IMPLANT
SHEET LAVH (DRAPES) ×2 IMPLANT
SUT CAPIO ETHIBPND (SUTURE) IMPLANT
SUT CAPIO POLYGLYCOLIC (SUTURE) IMPLANT
SUT ETHIBOND 0 (SUTURE) IMPLANT
SUT SILK 2 0 SH (SUTURE) IMPLANT
SUT SILK 3 0 SH CR/8 (SUTURE) ×2 IMPLANT
SUT VIC AB 0 CT1 27 (SUTURE)
SUT VIC AB 0 CT1 27XBRD ANTBC (SUTURE) IMPLANT
SUT VIC AB 2-0 CT1 27 (SUTURE)
SUT VIC AB 2-0 CT1 27XBRD (SUTURE) IMPLANT
SUT VIC AB 2-0 SH 27 (SUTURE) ×4
SUT VIC AB 2-0 SH 27X BRD (SUTURE) ×4 IMPLANT
SUT VIC AB 3-0 SH 27 (SUTURE)
SUT VIC AB 3-0 SH 27XBRD (SUTURE) IMPLANT
SUT VIC AB 4-0 PS2 27 (SUTURE) ×2 IMPLANT
SUT VICRYL 0 UR6 27IN ABS (SUTURE) IMPLANT
SYRINGE 10CC LL (SYRINGE) ×2 IMPLANT
TOWEL OR NON WOVEN STRL DISP B (DISPOSABLE) ×2 IMPLANT
TUBING CONNECTING 10 (TUBING) ×2 IMPLANT
WATER STERILE IRR 1500ML POUR (IV SOLUTION) IMPLANT
YANKAUER SUCT BULB TIP 10FT TU (MISCELLANEOUS) ×2 IMPLANT

## 2016-02-16 NOTE — Progress Notes (Signed)
Received patient from PACU, VS obtained, IVFs infusing, foley intact

## 2016-02-16 NOTE — Anesthesia Procedure Notes (Signed)

## 2016-02-16 NOTE — Addendum Note (Signed)
Addendum  created 02/16/16 1113 by Anne Fu, CRNA   Anesthesia Intra Meds edited

## 2016-02-16 NOTE — Interval H&P Note (Signed)
History and Physical Interval Note:  02/16/2016 7:09 AM  Madison Carey  has presented today for surgery, with the diagnosis of extruded sling  The various methods of treatment have been discussed with the patient and family. After consideration of risks, benefits and other options for treatment, the patient has consented to  Procedure(s): REVISION URINARY SLING (N/A) CYSTOSCOPY (N/A) as a surgical intervention .  The patient's history has been reviewed, patient examined, no change in status, stable for surgery.  I have reviewed the patient's chart and labs.  Questions were answered to the patient's satisfaction.     Silvina Hackleman A

## 2016-02-16 NOTE — Op Note (Signed)
Preoperative diagnosis: Sling extrusion Postoperative diagnosis: Sling extrusion and possible low-grade infection Surgery: Exploration and removal of vaginal component of sling and cystoscopy Surgeon: Dr. Nicki Reaper Neyda [place] Assistant: Debbrah Alar  The patient has the above diagnoses and consented to the above procedure. Extra care was taken with leg positioning to minimize the risk of compartment syndrome and neuropathy and deep vein thrombosis. There was excellent exposure with a double-ring retractor. My assistant was necessary for the case to assist in exposure. She was intimately involved with all the following steps  The area of extrusion in the vaginal sinus was much more apparent and now approximately 3-4 times in size versus what it was when I first examined her under anesthesia.It was about 4 mm in size. It was at the left urethrovesical angle. She had mucosal folds as previously noted  My plan was to make a low wide inverted U-shaped or almost boxlike incision to expose the sling. I was able to accomplish this quite readily and couldt even pass a right angle into the sinus and passed it carefully medially and incised over it. I developed flaps circumferentially. The sling was easily identified from the left to right side of the vagina. I decided to mobilize the sling to the right of the urethra initially. A 15 blade scalpel was utilized on top the sling and for a few millimeters above and below it. A right angle was passed. Gently it was spread. I incised the sling and one could see the usual popping and releasing of it. A tonsil was applied to the right side of the sling and I sharply dissected the sling material to the pelvic sidewall cutting it and removing it in total. Specimens were sent.   At the left urethrovesical angle the sling was frayed in my opinion and I wanted to remove it in total and be careful not to remove it piecemeal or leave pieces behind. A similar dissection described  above was utilized to the left of the midline and a right angle was passed. I was able to cut the sling and again sharply dissected to the pelvic sidewall on the left side. I was a bit surprised that the sling underneath the urethra was a little bit more difficult to mobilize in spite of the dissection. I passed 2 3-0 Vicryl sutures allowing me to pick up the sling and dissect along it. I was careful not to injure the urethra. The suburethral component was removed.  Hemostasis was excellent. I double checked visually and palpably and there was no sling left in the vagina. The entire vaginal wall component was removed. There was no injury to the urethra  I cystoscoped the patient. The bladder mucosa and trigone and urethra were normal.  The shallow flap was reapproximated with multiple running 2-0 Vicryl sutures on an SH needle. In a few areas I placed some interrupted sutures for further strength. A vaginal pack with Estrace cream was applied.  I was very pleased with the surgery and hopefully it'll reach the patient's treatment goal.

## 2016-02-16 NOTE — Transfer of Care (Signed)
Immediate Anesthesia Transfer of Care Note  Patient: Madison Carey  Procedure(s) Performed: Procedure(s): REVISION URINARY SLING (N/A) CYSTOSCOPY (N/A)  Patient Location: PACU  Anesthesia Type:General  Level of Consciousness:  sedated, patient cooperative and responds to stimulation  Airway & Oxygen Therapy:Patient Spontanous Breathing and Patient connected to face mask oxgen  Post-op Assessment:  Report given to PACU RN and Post -op Vital signs reviewed and stable  Post vital signs:  Reviewed and stable  Last Vitals:  Vitals:   02/16/16 0550  BP: 120/76  Pulse: 85  Resp: 16  Temp: 123XX123 C    Complications: No apparent anesthesia complications

## 2016-02-16 NOTE — Anesthesia Postprocedure Evaluation (Signed)
Anesthesia Post Note  Patient: Madison Carey  Procedure(s) Performed: Procedure(s) (LRB): REVISION URINARY SLING (N/A) CYSTOSCOPY (N/A)  Patient location during evaluation: PACU Anesthesia Type: General Level of consciousness: awake and alert Pain management: pain level controlled Vital Signs Assessment: post-procedure vital signs reviewed and stable Respiratory status: spontaneous breathing, nonlabored ventilation, respiratory function stable and patient connected to nasal cannula oxygen Cardiovascular status: blood pressure returned to baseline and stable Postop Assessment: no signs of nausea or vomiting Anesthetic complications: no    Last Vitals:  Vitals:   02/16/16 0550 02/16/16 1007  BP: 120/76 129/78  Pulse: 85 95  Resp: 16 16  Temp: 36.7 C 36.6 C    Last Pain:  Vitals:   02/16/16 0550  TempSrc: Oral                 [name]

## 2016-02-17 DIAGNOSIS — T83711A Erosion of implanted vaginal mesh and other prosthetic materials to surrounding organ or tissue, initial encounter: Secondary | ICD-10-CM | POA: Diagnosis not present

## 2016-02-17 LAB — BASIC METABOLIC PANEL
Anion gap: 9 (ref 5–15)
BUN: 8 mg/dL (ref 6–20)
CHLORIDE: 107 mmol/L (ref 101–111)
CO2: 26 mmol/L (ref 22–32)
CREATININE: 0.7 mg/dL (ref 0.44–1.00)
Calcium: 9.1 mg/dL (ref 8.9–10.3)
GFR calc Af Amer: >60 mL/min (ref >60)
GFR calc non Af Amer: >60 mL/min (ref >60)
Glucose, Bld: 127 mg/dL — ABNORMAL HIGH (ref 65–99)
POTASSIUM: 3.6 mmol/L (ref 3.5–5.1)
SODIUM: 142 mmol/L (ref 135–145)

## 2016-02-17 LAB — HEMOGLOBIN AND HEMATOCRIT, BLOOD
HEMATOCRIT: 34.9 % — AB (ref 36.0–46.0)
HEMOGLOBIN: 11.9 g/dL — AB (ref 12.0–15.0)

## 2016-02-17 NOTE — Progress Notes (Signed)
Foley d/c as ordered, packing removed as ordered, pt tol well, small amount of vaginal discharge noted. Explained to pt this was normal, and told her when to call the nurse. She acknowledged understanding. Will cont to monitor. SRP, RN

## 2016-02-17 NOTE — Progress Notes (Signed)
Looks great Headache from yesterday gone Still no vaginal pain signigficant Labs OK Instructions detailed

## 2016-02-17 NOTE — Discharge Summary (Signed)
Date of admission: 02/16/2016  Date of discharge: 02/17/2016  Admission diagnosis: eroded sling  Discharge diagnosis: eroded sling  Secondary diagnoses: vaginal pain  History and Physical: For full details, please see admission history and physical. Briefly, Madison Carey is a 45 y.o. year old patient with the above diagnosis. Marland Kitchen   Hospital Course: Removal of sling went very well. Post op course uneventful  Laboratory values:  Recent Labs  02/16/16 1544 02/17/16 0546  HGB 11.9* 11.9*  HCT 34.2* 34.9*    Recent Labs  02/17/16 0546  CREATININE 0.70    Disposition: Home  Discharge instruction: The patient was instructed to be ambulatory but told to refrain from heavy lifting, strenuous activity, or driving. Detailed  Discharge medications:    Medication List    STOP taking these medications   cholecalciferol 1000 units tablet Commonly known as:  VITAMIN D   OVER THE COUNTER MEDICATION   RA CRANBERRY SUPPLEMENTS PO     TAKE these medications   atorvastatin 10 MG tablet Commonly known as:  LIPITOR Take 10 mg by mouth daily.   ESTRACE VAGINAL 0.1 MG/GM vaginal cream Generic drug:  estradiol Place 1 application vaginally every 30 (thirty) days.   HYDROcodone-acetaminophen 5-325 MG tablet Commonly known as:  NORCO Take 1-2 tablets by mouth every 6 (six) hours as needed for moderate pain.   omeprazole 40 MG capsule Commonly known as:  PRILOSEC Take 40 mg by mouth every morning.   zolpidem 10 MG tablet Commonly known as:  AMBIEN Take 10 mg by mouth at bedtime as needed.       Followup:  Follow-up Information    Neilah Fulwider A, MD.   Specialty:  Urology Why:  office will call you with date and time of follow up appointment as scheduled Contact information: [place] [id number] [phone]

## 2016-02-17 NOTE — Discharge Instructions (Signed)
I have reviewed discharge instructions in detail with the patient. They will follow-up with me or their physician as scheduled. My nurse will also be calling the patients as per protocol. As discussed with Dr. Ascencion Stegner. ° °You may resume aspirin, advil, aleve, vitamins, and supplements 7 days after surgery. °

## 2016-02-17 NOTE — Progress Notes (Signed)
Pt discharged to home, reviewed discharge plans with pt, acknowledged understanding. SRP, RN

## 2016-02-26 ENCOUNTER — Encounter (HOSPITAL_COMMUNITY): Payer: Self-pay | Admitting: Emergency Medicine

## 2016-02-26 ENCOUNTER — Emergency Department (HOSPITAL_COMMUNITY)
Admission: EM | Admit: 2016-02-26 | Discharge: 2016-02-26 | Disposition: A | Discharge status: Home / Self Care | Payer: BLUE CROSS/BLUE SHIELD | Attending: Emergency Medicine | Admitting: Emergency Medicine

## 2016-02-26 DIAGNOSIS — Z791 Long term (current) use of non-steroidal anti-inflammatories (NSAID): Secondary | ICD-10-CM | POA: Diagnosis not present

## 2016-02-26 DIAGNOSIS — I10 Essential (primary) hypertension: Secondary | ICD-10-CM | POA: Insufficient documentation

## 2016-02-26 DIAGNOSIS — Z79899 Other long term (current) drug therapy: Secondary | ICD-10-CM | POA: Insufficient documentation

## 2016-02-26 DIAGNOSIS — N9989 Other postprocedural complications and disorders of genitourinary system: Secondary | ICD-10-CM | POA: Insufficient documentation

## 2016-02-26 DIAGNOSIS — Z87891 Personal history of nicotine dependence: Secondary | ICD-10-CM | POA: Diagnosis not present

## 2016-02-26 DIAGNOSIS — IMO0002 Reserved for concepts with insufficient information to code with codable children: Secondary | ICD-10-CM

## 2016-02-26 LAB — I-STAT CHEM 8, ED
BUN: 8 mg/dL (ref 6–20)
CREATININE: 0.8 mg/dL (ref 0.44–1.00)
Calcium, Ion: 1.22 mmol/L (ref 1.15–1.40)
Chloride: 106 mmol/L (ref 101–111)
GLUCOSE: 99 mg/dL (ref 65–99)
HCT: 33 % — ABNORMAL LOW (ref 36.0–46.0)
HEMOGLOBIN: 11.2 g/dL — AB (ref 12.0–15.0)
Potassium: 3.9 mmol/L (ref 3.5–5.1)
Sodium: 142 mmol/L (ref 135–145)
TCO2: 24 mmol/L (ref 0–100)

## 2016-02-26 LAB — CBC WITH DIFFERENTIAL/PLATELET
Basophils Absolute: 0 10*3/uL (ref 0.0–0.1)
Basophils Relative: 0 %
Eosinophils Absolute: 0.2 10*3/uL (ref 0.0–0.7)
Eosinophils Relative: 2 %
HEMATOCRIT: 33.7 % — AB (ref 36.0–46.0)
Hemoglobin: 11.7 g/dL — ABNORMAL LOW (ref 12.0–15.0)
LYMPHS ABS: 2.4 10*3/uL (ref 0.7–4.0)
LYMPHS PCT: 25 %
MCH: 31.3 pg (ref 26.0–34.0)
MCHC: 34.7 g/dL (ref 30.0–36.0)
MCV: 90.1 fL (ref 78.0–100.0)
MONO ABS: 0.7 10*3/uL (ref 0.1–1.0)
MONOS PCT: 8 %
NEUTROS ABS: 6.3 10*3/uL (ref 1.7–7.7)
Neutrophils Relative %: 65 %
Platelets: 286 10*3/uL (ref 150–400)
RBC: 3.74 MIL/uL — ABNORMAL LOW (ref 3.87–5.11)
RDW: 14 % (ref 11.5–15.5)
WBC: 9.6 10*3/uL (ref 4.0–10.5)

## 2016-02-26 LAB — URINALYSIS, ROUTINE W REFLEX MICROSCOPIC
Bilirubin Urine: NEGATIVE
GLUCOSE, UA: NEGATIVE mg/dL
KETONES UR: NEGATIVE mg/dL
Nitrite: NEGATIVE
PH: 6 (ref 5.0–8.0)
PROTEIN: NEGATIVE mg/dL
Specific Gravity, Urine: 1.005 (ref 1.005–1.030)

## 2016-02-26 LAB — URINE MICROSCOPIC-ADD ON

## 2016-02-26 NOTE — ED Provider Notes (Signed)
[facility] DEPT Provider Note   CSN: [id number] Arrival date & time: 02/26/16  1808     History   Chief Complaint Chief Complaint  Patient presents with  . Vaginal Bleeding    HPI Madison Carey is a 45 y.o. female.  HPI Patient had removal of suburethral sling around 9 days ago by urology. Uncomplicated until today when she began to have some vaginal bleeding. States she had a rush of blood. States it went through her underwear and through her pants. She's had small amounts of bleeding since. States she's been using pads. States the bleeding is almost stopped. States she talked the urologist on call and said to put a tampon in. Patient states she did not want to do this because she been told not to put anything up there. She is feeling fine otherwise. No lightheadedness dizziness. No abdominal pain. No fevers.   Past Medical History:  Diagnosis Date  . GERD (gastroesophageal reflux disease)   . High triglycerides   . History of frequent urinary tract infections   . History of hiatal hernia   . Hypertension   . Impaired fasting glucose   . Insomnia   . Obesity   . Other vitamin B12 deficiency anemias   . Plantar fascial fibromatosis   . Right arm fracture   . Urinary incontinence   . Vitamin D deficiency     Patient Active Problem List   Diagnosis Date Noted  . Erosion of suburethral sling (Gregory) 02/16/2016    Past Surgical History:  Procedure Laterality Date  . ABDOMINAL HYSTERECTOMY    . bladder tacking     . CHOLECYSTECTOMY    . crevical ablation     . CYSTOSCOPY N/A 02/16/2016   Procedure: CYSTOSCOPY;  Surgeon: Bjorn Loser, MD;  Location: [facility];  Service: Urology;  Laterality: N/A;  . REVISION URINARY SLING N/A 02/16/2016   Procedure: REMOVAL URINARY SLING;  Surgeon: Bjorn Loser, MD;  Location: [facility];  Service: Urology;  Laterality: N/A;    OB History    No data available       Home Medications    Prior to Admission medications     Medication Sig Start Date End Date Taking? Authorizing Provider  atorvastatin (LIPITOR) 10 MG tablet Take 10 mg by mouth daily. 01/04/15  Yes Historical Provider, MD  ESTRACE VAGINAL 0.1 MG/GM vaginal cream Place 1 application vaginally every 30 (thirty) days. 12/29/15  Yes Historical Provider, MD  HYDROcodone-acetaminophen (NORCO) 5-325 MG tablet Take 1-2 tablets by mouth every 6 (six) hours as needed for moderate pain. 02/16/16  Yes Amanda Dancy, PA-C  ibuprofen (ADVIL,MOTRIN) 800 MG tablet Take 800 mg by mouth every 6 (six) hours as needed for moderate pain.  02/17/16  Yes Historical Provider, MD  omeprazole (PRILOSEC) 40 MG capsule Take 40 mg by mouth every morning.  01/11/16  Yes Historical Provider, MD  zolpidem (AMBIEN) 10 MG tablet Take 10 mg by mouth at bedtime.  01/08/15  Yes Historical Provider, MD    Family History No family history on file.  Social History Social History  Substance Use Topics  . Smoking status: Former Smoker    Packs/day: 0.25    Years: 5.00    Types: Cigarettes  . Smokeless tobacco: Never Used  . Alcohol use No     Allergies   Codeine   Review of Systems Review of Systems  Constitutional: Negative for appetite change and fever.  Respiratory: Negative for shortness of breath.   Cardiovascular:  Negative for chest pain.  Gastrointestinal: Negative for abdominal pain.  Genitourinary: Positive for vaginal bleeding. Negative for vaginal discharge and vaginal pain.  Neurological: Negative for light-headedness.  Hematological: Does not bruise/bleed easily.     Physical Exam Updated Vital Signs BP 127/79   Pulse 77   Temp 97.9 F (36.6 C) (Oral)   Resp 18   SpO2 99%   Physical Exam  Constitutional: She appears well-developed.  HENT:  Head: Atraumatic.  Neck: Neck supple.  Pulmonary/Chest: Effort normal.  Abdominal: There is no tenderness.  Genitourinary:  Genitourinary Comments: Patient refused pelvic exam  Neurological: She is alert.  Skin:  Skin is warm. Capillary refill takes less than 2 seconds.     ED Treatments / Results  Labs (all labs ordered are listed, but only abnormal results are displayed) Labs Reviewed  CBC WITH DIFFERENTIAL/PLATELET - Abnormal; Notable for the following:       Result Value   RBC 3.74 (*)    Hemoglobin 11.7 (*)    HCT 33.7 (*)    All other components within normal limits  URINALYSIS, ROUTINE W REFLEX MICROSCOPIC (NOT AT [facility]) - Abnormal; Notable for the following:    APPearance CLOUDY (*)    Hgb urine dipstick LARGE (*)    Leukocytes, UA LARGE (*)    All other components within normal limits  URINE MICROSCOPIC-ADD ON - Abnormal; Notable for the following:    Squamous Epithelial / LPF 6-30 (*)    Bacteria, UA FEW (*)    All other components within normal limits  I-STAT CHEM 8, ED - Abnormal; Notable for the following:    Hemoglobin 11.2 (*)    HCT 33.0 (*)    All other components within normal limits    EKG  EKG Interpretation None       Radiology No results found.  Procedures Procedures (including critical care time)  Medications Ordered in ED Medications - No data to display   Initial Impression / Assessment and Plan / ED Course  I have reviewed the triage vital signs and the nursing notes.  Pertinent labs & imaging results that were available during my care of the patient were reviewed by me and considered in my medical decision making (see chart for details).  Clinical Course    Patient with vaginal bleeding after vaginal surgery. They had discussed with Dr. Tresa Moore who stated this could be a scab that had come off. This could easily be the cause. Her bleeding is decreased. Patient does not want a pelvic exam at this time. States she'll follow with urology if she does worse. Will return for worsening bleeding or symptomatic bleeding. Hemoglobin is at her baseline. Discharge home.  Final Clinical Impressions(s) / ED Diagnoses   Final diagnoses:  Postoperative  vaginal bleeding    New Prescriptions New Prescriptions   No medications on file     Davonna Belling, MD 02/26/16 2142

## 2016-02-26 NOTE — Discharge Instructions (Signed)
Return for worse bleeding or lightheadedness or dizziness.

## 2016-02-26 NOTE — ED Triage Notes (Signed)
Pt had a bladder tack removed 9/12. Pt states she had no complications until today and she felt a "ooze of blood"  Pt states she is having heavy bright red blood. Pt states she is on her second pad today. Pt states [facility] and they told her to use a tampon, but she states she is not supposed to use tampons, so she is only using pads.  Pt denies taking blood thinners. Pt denies lightheadedness

## 2016-07-07 DIAGNOSIS — K219 Gastro-esophageal reflux disease without esophagitis: Secondary | ICD-10-CM | POA: Diagnosis not present

## 2016-07-07 DIAGNOSIS — E782 Mixed hyperlipidemia: Secondary | ICD-10-CM | POA: Diagnosis not present

## 2016-07-07 DIAGNOSIS — G4709 Other insomnia: Secondary | ICD-10-CM | POA: Diagnosis not present

## 2016-07-27 DIAGNOSIS — R05 Cough: Secondary | ICD-10-CM | POA: Diagnosis not present

## 2016-07-27 DIAGNOSIS — J189 Pneumonia, unspecified organism: Secondary | ICD-10-CM | POA: Diagnosis not present

## 2016-07-27 DIAGNOSIS — J208 Acute bronchitis due to other specified organisms: Secondary | ICD-10-CM | POA: Diagnosis not present

## 2016-08-08 DIAGNOSIS — J069 Acute upper respiratory infection, unspecified: Secondary | ICD-10-CM | POA: Diagnosis not present

## 2016-10-05 DIAGNOSIS — R5383 Other fatigue: Secondary | ICD-10-CM | POA: Diagnosis not present

## 2016-10-05 DIAGNOSIS — E038 Other specified hypothyroidism: Secondary | ICD-10-CM | POA: Diagnosis not present

## 2016-10-05 DIAGNOSIS — E782 Mixed hyperlipidemia: Secondary | ICD-10-CM | POA: Diagnosis not present

## 2016-10-05 DIAGNOSIS — D518 Other vitamin B12 deficiency anemias: Secondary | ICD-10-CM | POA: Diagnosis not present

## 2016-10-28 DIAGNOSIS — L853 Xerosis cutis: Secondary | ICD-10-CM | POA: Diagnosis not present

## 2016-10-28 DIAGNOSIS — L301 Dyshidrosis [pompholyx]: Secondary | ICD-10-CM | POA: Diagnosis not present

## 2016-10-28 DIAGNOSIS — D171 Benign lipomatous neoplasm of skin and subcutaneous tissue of trunk: Secondary | ICD-10-CM | POA: Diagnosis not present

## 2016-11-17 DIAGNOSIS — K219 Gastro-esophageal reflux disease without esophagitis: Secondary | ICD-10-CM | POA: Diagnosis not present

## 2016-11-17 DIAGNOSIS — F5102 Adjustment insomnia: Secondary | ICD-10-CM | POA: Diagnosis not present

## 2016-11-17 DIAGNOSIS — E782 Mixed hyperlipidemia: Secondary | ICD-10-CM | POA: Diagnosis not present

## 2016-12-30 DIAGNOSIS — M9905 Segmental and somatic dysfunction of pelvic region: Secondary | ICD-10-CM | POA: Diagnosis not present

## 2016-12-30 DIAGNOSIS — M9901 Segmental and somatic dysfunction of cervical region: Secondary | ICD-10-CM | POA: Diagnosis not present

## 2016-12-30 DIAGNOSIS — M5430 Sciatica, unspecified side: Secondary | ICD-10-CM | POA: Diagnosis not present

## 2017-01-02 DIAGNOSIS — M5430 Sciatica, unspecified side: Secondary | ICD-10-CM | POA: Diagnosis not present

## 2017-01-02 DIAGNOSIS — M9905 Segmental and somatic dysfunction of pelvic region: Secondary | ICD-10-CM | POA: Diagnosis not present

## 2017-01-02 DIAGNOSIS — L209 Atopic dermatitis, unspecified: Secondary | ICD-10-CM | POA: Diagnosis not present

## 2017-01-02 DIAGNOSIS — M9901 Segmental and somatic dysfunction of cervical region: Secondary | ICD-10-CM | POA: Diagnosis not present

## 2017-01-06 DIAGNOSIS — M5430 Sciatica, unspecified side: Secondary | ICD-10-CM | POA: Diagnosis not present

## 2017-01-06 DIAGNOSIS — M9905 Segmental and somatic dysfunction of pelvic region: Secondary | ICD-10-CM | POA: Diagnosis not present

## 2017-01-06 DIAGNOSIS — M9901 Segmental and somatic dysfunction of cervical region: Secondary | ICD-10-CM | POA: Diagnosis not present

## 2017-01-09 DIAGNOSIS — M9901 Segmental and somatic dysfunction of cervical region: Secondary | ICD-10-CM | POA: Diagnosis not present

## 2017-01-09 DIAGNOSIS — M9905 Segmental and somatic dysfunction of pelvic region: Secondary | ICD-10-CM | POA: Diagnosis not present

## 2017-01-09 DIAGNOSIS — M5430 Sciatica, unspecified side: Secondary | ICD-10-CM | POA: Diagnosis not present

## 2017-01-13 DIAGNOSIS — M5430 Sciatica, unspecified side: Secondary | ICD-10-CM | POA: Diagnosis not present

## 2017-01-13 DIAGNOSIS — M9905 Segmental and somatic dysfunction of pelvic region: Secondary | ICD-10-CM | POA: Diagnosis not present

## 2017-01-13 DIAGNOSIS — M9901 Segmental and somatic dysfunction of cervical region: Secondary | ICD-10-CM | POA: Diagnosis not present

## 2017-01-24 DIAGNOSIS — J0110 Acute frontal sinusitis, unspecified: Secondary | ICD-10-CM | POA: Diagnosis not present

## 2017-01-24 DIAGNOSIS — J0100 Acute maxillary sinusitis, unspecified: Secondary | ICD-10-CM | POA: Diagnosis not present

## 2017-05-04 DIAGNOSIS — F5102 Adjustment insomnia: Secondary | ICD-10-CM | POA: Diagnosis not present

## 2017-05-04 DIAGNOSIS — K219 Gastro-esophageal reflux disease without esophagitis: Secondary | ICD-10-CM | POA: Diagnosis not present

## 2017-05-04 DIAGNOSIS — E782 Mixed hyperlipidemia: Secondary | ICD-10-CM | POA: Diagnosis not present

## 2017-05-04 DIAGNOSIS — R5383 Other fatigue: Secondary | ICD-10-CM | POA: Diagnosis not present

## 2017-05-18 DIAGNOSIS — Z1231 Encounter for screening mammogram for malignant neoplasm of breast: Secondary | ICD-10-CM | POA: Diagnosis not present

## 2017-05-19 DIAGNOSIS — R945 Abnormal results of liver function studies: Secondary | ICD-10-CM | POA: Diagnosis not present

## 2017-06-06 DIAGNOSIS — M25572 Pain in left ankle and joints of left foot: Secondary | ICD-10-CM | POA: Diagnosis not present

## 2017-06-06 DIAGNOSIS — A084 Viral intestinal infection, unspecified: Secondary | ICD-10-CM | POA: Diagnosis not present

## 2017-06-06 DIAGNOSIS — S99912A Unspecified injury of left ankle, initial encounter: Secondary | ICD-10-CM | POA: Diagnosis not present

## 2017-06-06 DIAGNOSIS — R112 Nausea with vomiting, unspecified: Secondary | ICD-10-CM | POA: Diagnosis not present

## 2017-06-16 DIAGNOSIS — J0100 Acute maxillary sinusitis, unspecified: Secondary | ICD-10-CM | POA: Diagnosis not present

## 2017-08-23 DIAGNOSIS — F5102 Adjustment insomnia: Secondary | ICD-10-CM | POA: Diagnosis not present

## 2017-08-23 DIAGNOSIS — M7551 Bursitis of right shoulder: Secondary | ICD-10-CM | POA: Diagnosis not present

## 2017-08-23 DIAGNOSIS — M7541 Impingement syndrome of right shoulder: Secondary | ICD-10-CM | POA: Diagnosis not present

## 2017-08-29 DIAGNOSIS — M5412 Radiculopathy, cervical region: Secondary | ICD-10-CM | POA: Diagnosis not present

## 2017-08-29 DIAGNOSIS — M7541 Impingement syndrome of right shoulder: Secondary | ICD-10-CM | POA: Diagnosis not present

## 2017-09-29 DIAGNOSIS — R531 Weakness: Secondary | ICD-10-CM | POA: Diagnosis not present

## 2017-09-29 DIAGNOSIS — L209 Atopic dermatitis, unspecified: Secondary | ICD-10-CM | POA: Diagnosis not present

## 2017-09-29 DIAGNOSIS — L821 Other seborrheic keratosis: Secondary | ICD-10-CM | POA: Diagnosis not present

## 2017-09-29 DIAGNOSIS — L400 Psoriasis vulgaris: Secondary | ICD-10-CM | POA: Diagnosis not present

## 2017-11-01 DIAGNOSIS — F5102 Adjustment insomnia: Secondary | ICD-10-CM | POA: Diagnosis not present

## 2017-11-01 DIAGNOSIS — E782 Mixed hyperlipidemia: Secondary | ICD-10-CM | POA: Diagnosis not present

## 2017-11-01 DIAGNOSIS — K219 Gastro-esophageal reflux disease without esophagitis: Secondary | ICD-10-CM | POA: Diagnosis not present

## 2017-11-01 DIAGNOSIS — L400 Psoriasis vulgaris: Secondary | ICD-10-CM | POA: Diagnosis not present

## 2017-12-01 DIAGNOSIS — L4050 Arthropathic psoriasis, unspecified: Secondary | ICD-10-CM | POA: Diagnosis not present

## 2017-12-01 DIAGNOSIS — L400 Psoriasis vulgaris: Secondary | ICD-10-CM | POA: Diagnosis not present

## 2018-02-14 DIAGNOSIS — L400 Psoriasis vulgaris: Secondary | ICD-10-CM | POA: Diagnosis not present

## 2018-02-14 DIAGNOSIS — D485 Neoplasm of uncertain behavior of skin: Secondary | ICD-10-CM | POA: Diagnosis not present

## 2018-02-14 DIAGNOSIS — L309 Dermatitis, unspecified: Secondary | ICD-10-CM | POA: Diagnosis not present

## 2018-02-28 DIAGNOSIS — L209 Atopic dermatitis, unspecified: Secondary | ICD-10-CM | POA: Diagnosis not present

## 2018-02-28 DIAGNOSIS — L301 Dyshidrosis [pompholyx]: Secondary | ICD-10-CM | POA: Diagnosis not present

## 2018-04-06 DIAGNOSIS — J209 Acute bronchitis, unspecified: Secondary | ICD-10-CM | POA: Diagnosis not present

## 2018-05-17 DIAGNOSIS — L02415 Cutaneous abscess of right lower limb: Secondary | ICD-10-CM | POA: Diagnosis not present

## 2018-05-17 DIAGNOSIS — Z6832 Body mass index (BMI) 32.0-32.9, adult: Secondary | ICD-10-CM | POA: Diagnosis not present

## 2018-06-18 DIAGNOSIS — L299 Pruritus, unspecified: Secondary | ICD-10-CM | POA: Diagnosis not present

## 2018-06-18 DIAGNOSIS — L209 Atopic dermatitis, unspecified: Secondary | ICD-10-CM | POA: Diagnosis not present

## 2018-06-25 DIAGNOSIS — E782 Mixed hyperlipidemia: Secondary | ICD-10-CM | POA: Diagnosis not present

## 2018-06-25 DIAGNOSIS — E6609 Other obesity due to excess calories: Secondary | ICD-10-CM | POA: Diagnosis not present

## 2018-06-25 DIAGNOSIS — Z1231 Encounter for screening mammogram for malignant neoplasm of breast: Secondary | ICD-10-CM | POA: Diagnosis not present

## 2018-07-07 DIAGNOSIS — K529 Noninfective gastroenteritis and colitis, unspecified: Secondary | ICD-10-CM | POA: Diagnosis not present

## 2018-07-27 DIAGNOSIS — E6609 Other obesity due to excess calories: Secondary | ICD-10-CM | POA: Diagnosis not present

## 2018-08-08 DIAGNOSIS — R590 Localized enlarged lymph nodes: Secondary | ICD-10-CM | POA: Diagnosis not present

## 2018-08-08 DIAGNOSIS — Z01419 Encounter for gynecological examination (general) (routine) without abnormal findings: Secondary | ICD-10-CM | POA: Diagnosis not present

## 2018-08-08 DIAGNOSIS — Z1231 Encounter for screening mammogram for malignant neoplasm of breast: Secondary | ICD-10-CM | POA: Diagnosis not present

## 2018-08-13 DIAGNOSIS — Z1231 Encounter for screening mammogram for malignant neoplasm of breast: Secondary | ICD-10-CM | POA: Diagnosis not present

## 2018-08-21 DIAGNOSIS — N644 Mastodynia: Secondary | ICD-10-CM | POA: Insufficient documentation

## 2018-08-21 DIAGNOSIS — R590 Localized enlarged lymph nodes: Secondary | ICD-10-CM | POA: Diagnosis not present

## 2018-08-23 DIAGNOSIS — D171 Benign lipomatous neoplasm of skin and subcutaneous tissue of trunk: Secondary | ICD-10-CM | POA: Diagnosis not present

## 2018-08-23 DIAGNOSIS — N644 Mastodynia: Secondary | ICD-10-CM | POA: Diagnosis not present

## 2019-08-11 ENCOUNTER — Other Ambulatory Visit: Payer: Self-pay | Admitting: Legal Medicine

## 2019-08-11 DIAGNOSIS — E782 Mixed hyperlipidemia: Secondary | ICD-10-CM

## 2019-08-30 ENCOUNTER — Encounter: Payer: Self-pay | Admitting: Legal Medicine

## 2019-08-30 ENCOUNTER — Ambulatory Visit: Payer: 59 | Admitting: Legal Medicine

## 2019-08-30 ENCOUNTER — Other Ambulatory Visit: Payer: Self-pay

## 2019-08-30 VITALS — BP 110/60 | HR 99 | Temp 97.0°F | Resp 16 | Ht 62.0 in | Wt 199.8 lb

## 2019-08-30 DIAGNOSIS — F4323 Adjustment disorder with mixed anxiety and depressed mood: Secondary | ICD-10-CM | POA: Diagnosis not present

## 2019-08-30 DIAGNOSIS — F5102 Adjustment insomnia: Secondary | ICD-10-CM | POA: Insufficient documentation

## 2019-08-30 DIAGNOSIS — F4321 Adjustment disorder with depressed mood: Secondary | ICD-10-CM

## 2019-08-30 DIAGNOSIS — K219 Gastro-esophageal reflux disease without esophagitis: Secondary | ICD-10-CM | POA: Diagnosis not present

## 2019-08-30 DIAGNOSIS — Z1231 Encounter for screening mammogram for malignant neoplasm of breast: Secondary | ICD-10-CM

## 2019-08-30 DIAGNOSIS — E782 Mixed hyperlipidemia: Secondary | ICD-10-CM | POA: Insufficient documentation

## 2019-08-30 MED ORDER — ESCITALOPRAM OXALATE 10 MG PO TABS: 10.0000 mg | ORAL_TABLET | Freq: Every day | ORAL | 3 refills | Status: DC

## 2019-08-30 MED ORDER — OMEPRAZOLE 40 MG PO CPDR: 40.0000 mg | DELAYED_RELEASE_CAPSULE | ORAL | 6 refills | Status: DC

## 2019-08-30 NOTE — Patient Instructions (Signed)
Depression Screening Depression screening is a tool that your health care provider can use to learn if you have symptoms of depression. Depression is a common condition with many symptoms that are also often found in other conditions. Depression is treatable, but it must first be diagnosed. You may not know that certain feelings, thoughts, and behaviors that you are having can be symptoms of depression. Taking a depression screening test can help you and your health care provider decide if you need more assessment, or if you should be referred to a mental health care provider. What are the screening tests?  You may have a physical exam to see if another condition is affecting your mental health. You may have a blood or urine sample taken during the physical exam.  You may be interviewed using a screening tool that was developed from research, such as one of these: ? Patient Health Questionnaire (PHQ). This is a set of either 2 or 9 questions. A health care provider who has been trained to score this screening test uses a guide to assess if your symptoms suggest that you may have depression. ? Hamilton Depression Rating Scale (HAM-D). This is a set of either 17 or 24 questions. You may be asked to take it again during or after your treatment, to see if your depression has gotten better. ? Beck Depression Inventory (BDI). This is a set of 21 multiple choice questions. Your health care provider scores your answers to assess:  Your level of depression, ranging from mild to severe.  Your response to treatment.  Your health care provider may talk with you about your daily activities, such as eating, sleeping, work, and recreation, and ask if you have had any changes in activity.  Your health care provider may ask you to see a mental health specialist, such as a psychiatrist or psychologist, for more evaluation. Who should be screened for depression?   All adults, including adults with a family history  of a mental health disorder.  Adolescents who are 12-18 years old.  People who are recovering from a myocardial infarction (MI).  Pregnant women, or women who have given birth.  People who have a long-term (chronic) illness.  Anyone who has been diagnosed with another type of a mental health disorder.  Anyone who has symptoms that could show depression. What do my results mean? Your health care provider will review the results of your depression screening, physical exam, and lab tests. Positive screens suggest that you may have depression. Screening is the first step in getting the care that you may need. It is up to you to get your screening results. Ask your health care provider, or the department that is doing your screening tests, when your results will be ready. Talk with your health care provider about your results and diagnosis. A diagnosis of depression is made using the Diagnostic and Statistical Manual of Mental Disorders (DSM-V). This is a book that lists the number and type of symptoms that must be present for a health care provider to give a specific diagnosis.  Your health care provider may work with you to treat your symptoms of depression, or your health care provider may help you find a mental health provider who can assess, diagnose, and treat your depression. Get help right away if:  You have thoughts about hurting yourself or others. If you ever feel like you may hurt yourself or others, or have thoughts about taking your own life, get help right away. You   can go to your nearest emergency department or call:  Your local emergency services (911 in the [place]  A suicide crisis helpline, such as the National Suicide Prevention Lifeline at 1-800-273-8255. This is open 24 hours a day. Summary  Depression screening is the first step in getting the help that you may need.  If your screening test shows symptoms of depression (is positive), your health care provider may ask  you to see a mental health provider.  Anyone who is age 12 or older should be screened for depression. This information is not intended to replace advice given to you by your health care provider. Make sure you discuss any questions you have with your health care provider. Document Revised: 05/05/2017 Document Reviewed: 10/07/2016 Elsevier Patient Education  2020 Elsevier Inc.  

## 2019-08-30 NOTE — Progress Notes (Signed)
New Patient Office Visit  Subjective:  Patient ID: Madison Carey, female    DOB: 1970-11-11  Age: 49 y.o. MRN: [id number]  CC:  Chief Complaint  Patient presents with  . Hyperlipidemia    HPI Madison Carey presents for Chronic visit.  Patient has gastroesophageal reflux symptoms withesophagitis and LTRD.  The symptoms are severe intensity.  Length of symptoms 5 years.  Medicines include none.  Complications include none  She is aggravated  Patient presents with hyperlipidemia.  Compliance with treatment has been good; patient takes medicines as directed, maintains low cholesterol diet, follows up as directed, and maintains exercise regimen.  Patient is using rosuvastatin without problems.  This patient has adjustment depression for 1year.  PHQ9 =15.  Patient is having more anhedonia.  The patient has less future plans and prospects.  The depression is worse with stress.  The patient is exercising and working on behavior to improve mental health.  Patient is not seeing a therapist or psychiatrist.  na  Patient is on none.  Past Medical History:  Diagnosis Date  . Adjustment insomnia   . GERD (gastroesophageal reflux disease)   . GERD (gastroesophageal reflux disease)   . High triglycerides   . History of frequent urinary tract infections   . History of hiatal hernia   . Hypertension   . Impaired fasting glucose   . Insomnia   . Mixed hyperlipidemia   . Obesity   . Other vitamin B12 deficiency anemias   . Plantar fascial fibromatosis   . Right arm fracture   . Urinary incontinence   . Vitamin D deficiency     Past Surgical History:  Procedure Laterality Date  . ABDOMINAL HYSTERECTOMY    . bladder tacking     . CHOLECYSTECTOMY    . crevical ablation     . CYSTOSCOPY N/A 02/16/2016   Procedure: CYSTOSCOPY;  Surgeon: Bjorn Loser, MD;  Location: [facility];  Service: Urology;  Laterality: N/A;  . REVISION URINARY SLING N/A 02/16/2016   Procedure: REMOVAL URINARY  SLING;  Surgeon: Bjorn Loser, MD;  Location: [facility];  Service: Urology;  Laterality: N/A;    Family History  Problem Relation Age of Onset  . COPD Mother   . Asthma Mother   . Heart disease Father   . Hyperlipidemia Father     Social History   Socioeconomic History  . Marital status: Married    Spouse name: Not on file  . Number of children: Not on file  . Years of education: Not on file  . Highest education level: Not on file  Occupational History  . Not on file  Tobacco Use  . Smoking status: Former Smoker    Packs/day: 0.25    Years: 5.00    Pack years: 1.25    Types: Cigarettes  . Smokeless tobacco: Never Used  Substance and Sexual Activity  . Alcohol use: No    Alcohol/week: 0.0 standard drinks  . Drug use: No  . Sexual activity: Not on file  Other Topics Concern  . Not on file  Social History Narrative  . Not on file   Social Determinants of Health   Financial Resource Strain:   . Difficulty of Paying Living Expenses:   Food Insecurity:   . Worried About Charity fundraiser in the Last Year:   . Arboriculturist in the Last Year:   Transportation Needs:   . Film/video editor (Medical):   Marland Kitchen Lack of  Transportation (Non-Medical):   Physical Activity:   . Days of Exercise per Week:   . Minutes of Exercise per Session:   Stress:   . Feeling of Stress :   Social Connections:   . Frequency of Communication with Friends and Family:   . Frequency of Social Gatherings with Friends and Family:   . Attends Religious Services:   . Active Member of Clubs or Organizations:   . Attends Archivist Meetings:   Marland Kitchen Marital Status:   Intimate Partner Violence:   . Fear of Current or Ex-Partner:   . Emotionally Abused:   Marland Kitchen Physically Abused:   . Sexually Abused:     ROS Review of Systems  Constitutional: Negative.   HENT: Negative.   Eyes: Negative.   Respiratory: Negative.   Cardiovascular: Negative.   Gastrointestinal: Negative.    Endocrine: Negative.   Genitourinary: Negative.   Musculoskeletal: Negative.   Skin: Negative.   Neurological: Negative.   Psychiatric/Behavioral: Positive for dysphoric mood.    Objective:   Today's Vitals: BP 110/60   Pulse 99   Temp (!) 97 F (36.1 C)   Resp 16   Ht 5\' 2"  (1.575 m)   Wt 199 lb 12.8 oz (90.6 kg)   SpO2 97%   BMI 36.54 kg/m   Physical Exam Vitals reviewed.  Constitutional:      Appearance: Normal appearance.  HENT:     Head: Normocephalic and atraumatic.     Nose: Nose normal.     Mouth/Throat:     Mouth: Mucous membranes are dry.  Eyes:     Extraocular Movements: Extraocular movements intact.     Pupils: Pupils are equal, round, and reactive to light.  Cardiovascular:     Rate and Rhythm: Normal rate and regular rhythm.     Pulses: Normal pulses.     Heart sounds: Normal heart sounds.  Pulmonary:     Effort: Pulmonary effort is normal.     Breath sounds: Normal breath sounds.  Abdominal:     General: Abdomen is flat.  Musculoskeletal:        General: Normal range of motion.     Cervical back: Normal range of motion and neck supple.  Skin:    General: Skin is warm and dry.     Capillary Refill: Capillary refill takes less than 2 seconds.  Neurological:     General: No focal deficit present.     Mental Status: She is alert and oriented to person, place, and time.  Psychiatric:     Comments: Crying, depressed     Assessment & Plan:   Problem List Items Addressed This Visit      Digestive   GERD (gastroesophageal reflux disease)    Plan of care was formulated today.  She is doing well.  A plan of care was formulated using patient exam, tests and other sources to optimize care using evidence based information.  Recommend no smoking, no eating after supper, avoid fatty foods, elevate Head of bed, avoid tight fitting clothing.  Continue on omeprazole.      Relevant Medications   omeprazole (PRILOSEC) 40 MG capsule     Other   Mixed  hyperlipidemia    AN INDIVIDUAL CARE PLAN was established and reinforced today.  The patient's status was assessed using clinical findings on exam, lab and other diagnostic tests. The patient's disease status was assessed based on evidence-based guidelines and found to be well controlled. MEDICATIONS were reviewed. SELF MANAGEMENT GOALS  have been discussed and patient's success at attaining the goal of low cholesterol was assessed. RECOMMENDATION given include regular exercise 3 days a week and low cholesterol/low fat diet. CLINICAL SUMMARY including written plan to identify barriers unique to the patient due to social or economic  reasons was discussed.      Relevant Orders   CBC with Differential (Completed)   Comprehensive metabolic panel (Completed)   Lipid Panel (Completed)   Adjustment insomnia    Inso,nia likely related to her depression.      Adjustment disorder with depressed mood    Patient's depression is poorly controlled with no medicines.   Anhedonia worse.  PHQ 9 wasperformed score 15. An individual care plan was established or reinforced today.  The patient's disease status was assessed using clinical findings on exam, labs, and or other diagnostic testing to determine patient's success in meeting treatment goals based on disease specific evidence-based guidelines and found to be worsening Recommendations include change medicines to lexipro       Other Visit Diagnoses    Adjustment reaction with anxiety and depression    -  Primary   Relevant Medications   escitalopram (LEXAPRO) 10 MG tablet   Other Relevant Orders   TSH (Completed)   Screening mammogram, encounter for       Relevant Orders   MM Digital Screening      Outpatient Encounter Medications as of 08/30/2019  Medication Sig  . escitalopram (LEXAPRO) 10 MG tablet Take 1 tablet (10 mg total) by mouth daily.  . rosuvastatin (CRESTOR) 20 MG tablet TAKE 1 TABLET BY MOUTH EVERY DAY AS DIRECTED  . [DISCONTINUED]  atorvastatin (LIPITOR) 10 MG tablet Take 10 mg by mouth daily.  . [DISCONTINUED] escitalopram (LEXAPRO) 10 MG tablet Take 10 mg by mouth daily.  Marland Kitchen omeprazole (PRILOSEC) 40 MG capsule Take 1 capsule (40 mg total) by mouth every morning.  . [DISCONTINUED] ESTRACE VAGINAL 0.1 MG/GM vaginal cream Place 1 application vaginally every 30 (thirty) days.  . [DISCONTINUED] HYDROcodone-acetaminophen (NORCO) 5-325 MG tablet Take 1-2 tablets by mouth every 6 (six) hours as needed for moderate pain.  . [DISCONTINUED] ibuprofen (ADVIL,MOTRIN) 800 MG tablet Take 800 mg by mouth every 6 (six) hours as needed for moderate pain.   . [DISCONTINUED] omeprazole (PRILOSEC) 40 MG capsule Take 40 mg by mouth every morning.   . [DISCONTINUED] zolpidem (AMBIEN) 10 MG tablet TAKE 1 TABLET BY MOUTH EVERY DAY AT BEDTIME AS NEEDED FOR INSOMNIA   No facility-administered encounter medications on file as of 08/30/2019.    Follow-up: Return in about 2 weeks (around 09/13/2019).   Reinaldo Meeker, MD

## 2019-08-31 DIAGNOSIS — F4321 Adjustment disorder with depressed mood: Secondary | ICD-10-CM | POA: Insufficient documentation

## 2019-08-31 LAB — COMPREHENSIVE METABOLIC PANEL
ALT: 29 IU/L (ref 0–32)
AST: 23 IU/L (ref 0–40)
Albumin/Globulin Ratio: 1.7 (ref 1.2–2.2)
Albumin: 4.4 g/dL (ref 3.8–4.8)
Alkaline Phosphatase: 71 IU/L (ref 39–117)
BUN/Creatinine Ratio: 16 (ref 9–23)
BUN: 12 mg/dL (ref 6–24)
Bilirubin Total: 0.5 mg/dL (ref 0.0–1.2)
CO2: 21 mmol/L (ref 20–29)
Calcium: 9.7 mg/dL (ref 8.7–10.2)
Chloride: 108 mmol/L — ABNORMAL HIGH (ref 96–106)
Creatinine, Ser: 0.75 mg/dL (ref 0.57–1.00)
GFR calc Af Amer: 107 mL/min/{1.73_m2} (ref >59)
GFR calc non Af Amer: 93 mL/min/{1.73_m2} (ref >59)
Globulin, Total: 2.6 g/dL (ref 1.5–4.5)
Glucose: 98 mg/dL (ref 65–99)
Potassium: 4.2 mmol/L (ref 3.5–5.2)
Sodium: 141 mmol/L (ref 134–144)
Total Protein: 7 g/dL (ref 6.0–8.5)

## 2019-08-31 LAB — CBC WITH DIFFERENTIAL/PLATELET
Basophils Absolute: 0 10*3/uL (ref 0.0–0.2)
Basos: 1 %
EOS (ABSOLUTE): 0.1 10*3/uL (ref 0.0–0.4)
Eos: 1 %
Hematocrit: 35.8 % (ref 34.0–46.6)
Hemoglobin: 12.2 g/dL (ref 11.1–15.9)
Immature Grans (Abs): 0 10*3/uL (ref 0.0–0.1)
Immature Granulocytes: 0 %
Lymphocytes Absolute: 1.6 10*3/uL (ref 0.7–3.1)
Lymphs: 28 %
MCH: 32.1 pg (ref 26.6–33.0)
MCHC: 34.1 g/dL (ref 31.5–35.7)
MCV: 94 fL (ref 79–97)
Monocytes Absolute: 0.5 10*3/uL (ref 0.1–0.9)
Monocytes: 9 %
Neutrophils Absolute: 3.6 10*3/uL (ref 1.4–7.0)
Neutrophils: 61 %
Platelets: 239 10*3/uL (ref 150–450)
RBC: 3.8 x10E6/uL (ref 3.77–5.28)
RDW: 13.5 % (ref 11.7–15.4)
WBC: 5.8 10*3/uL (ref 3.4–10.8)

## 2019-08-31 LAB — LIPID PANEL
Chol/HDL Ratio: 3.8 ratio (ref 0.0–4.4)
Cholesterol, Total: 165 mg/dL (ref 100–199)
HDL: 43 mg/dL (ref >39)
LDL Chol Calc (NIH): 80 mg/dL (ref 0–99)
Triglycerides: 255 mg/dL — ABNORMAL HIGH (ref 0–149)
VLDL Cholesterol Cal: 42 mg/dL — ABNORMAL HIGH (ref 5–40)

## 2019-08-31 LAB — CARDIOVASCULAR RISK ASSESSMENT

## 2019-08-31 LAB — TSH: TSH: 0.856 u[IU]/mL (ref 0.450–4.500)

## 2019-08-31 NOTE — Assessment & Plan Note (Signed)

## 2019-08-31 NOTE — Assessment & Plan Note (Signed)
Patient's depression is poorly controlled with no medicines.   Anhedonia worse.  PHQ 9 wasperformed score 15. An individual care plan was established or reinforced today.  The patient's disease status was assessed using clinical findings on exam, labs, and or other diagnostic testing to determine patient's success in meeting treatment goals based on disease specific evidence-based guidelines and found to be worsening Recommendations include change medicines to lexipro

## 2019-08-31 NOTE — Progress Notes (Signed)
TSH 0.856 normal, CBC normal, Kidney and liver tests normal, triglycerides 255 high- watch diet,  lp

## 2019-08-31 NOTE — Assessment & Plan Note (Signed)
Inso,nia likely related to her depression.

## 2019-08-31 NOTE — Assessment & Plan Note (Signed)
Plan of care was formulated today.  She is doing well.  A plan of care was formulated using patient exam, tests and other sources to optimize care using evidence based information.  Recommend no smoking, no eating after supper, avoid fatty foods, elevate Head of bed, avoid tight fitting clothing.  Continue on omeprazole. 

## 2019-09-03 ENCOUNTER — Telehealth: Payer: Self-pay

## 2019-09-03 NOTE — Telephone Encounter (Signed)
Patient was informed about the results of the labs.

## 2019-09-11 ENCOUNTER — Ambulatory Visit: Payer: 59 | Admitting: Legal Medicine

## 2019-09-11 ENCOUNTER — Encounter: Payer: Self-pay | Admitting: Legal Medicine

## 2019-09-11 ENCOUNTER — Other Ambulatory Visit: Payer: Self-pay

## 2019-09-11 VITALS — BP 122/70 | HR 85 | Temp 97.8°F | Ht 62.0 in | Wt 200.6 lb

## 2019-09-11 DIAGNOSIS — E782 Mixed hyperlipidemia: Secondary | ICD-10-CM | POA: Diagnosis not present

## 2019-09-11 DIAGNOSIS — F4321 Adjustment disorder with depressed mood: Secondary | ICD-10-CM | POA: Diagnosis not present

## 2019-09-11 MED ORDER — ESCITALOPRAM OXALATE 20 MG PO TABS: 20.0000 mg | ORAL_TABLET | Freq: Every day | ORAL | 3 refills | Status: DC

## 2019-09-11 NOTE — Assessment & Plan Note (Signed)
AN INDIVIDUAL CARE PLAN for hyperlipidemia/ cholesterol was established and reinforced today.  The patient's status was assessed using clinical findings on exam, lab and other diagnostic tests. The patient's disease status was assessed based on evidence-based guidelines and found to be good controlled. MEDICATIONS were reviewed. SELF MANAGEMENT GOALS have been discussed and patient's success at attaining the goal of low cholesterol was assessed. RECOMMENDATION given include regular exercise 3 days a week and low cholesterol/low fat diet. CLINICAL SUMMARY including written plan to identify barriers unique to the patient due to social or economic  reasons was discussed.

## 2019-09-11 NOTE — Progress Notes (Signed)
Established Patient Office Visit  Subjective:  Patient ID: Madison Carey, female    DOB: [birth year]  Age: 49 y.o. MRN: [id number]  CC:  Chief Complaint  Patient presents with  . ajustment disorder with depressed mood    HPI Madison Carey presents for depression  Patient presents with hyperlipidemia.  Compliance with treatment has been good; patient takes medicines as directed, maintains low cholesterol diet, follows up as directed, and maintains exercise regimen.  Patient is using rosuvastatin without problems.  This patient has major depression for 6 months.  PHQ9 =11.  Patient is having less anhedonia.  The patient has less future plans and prospects.  The depression is worse with stress.  The patient is exercising and working on behavior to improve mental health.  Patient is not seeing a therapist or psychiatrist.  na  Patient is on lexapro..  Past Medical History:  Diagnosis Date  . Adjustment insomnia   . GERD (gastroesophageal reflux disease)   . GERD (gastroesophageal reflux disease)   . High triglycerides   . History of frequent urinary tract infections   . History of hiatal hernia   . Hypertension   . Impaired fasting glucose   . Insomnia   . Mixed hyperlipidemia   . Obesity   . Other vitamin B12 deficiency anemias   . Plantar fascial fibromatosis   . Right arm fracture   . Urinary incontinence   . Vitamin D deficiency     Past Surgical History:  Procedure Laterality Date  . ABDOMINAL HYSTERECTOMY    . bladder tacking     . CHOLECYSTECTOMY    . crevical ablation     . CYSTOSCOPY N/A 02/16/2016   Procedure: CYSTOSCOPY;  Surgeon: Bjorn Loser, MD;  Location: [facility];  Service: Urology;  Laterality: N/A;  . REVISION URINARY SLING N/A 02/16/2016   Procedure: REMOVAL URINARY SLING;  Surgeon: Bjorn Loser, MD;  Location: [facility];  Service: Urology;  Laterality: N/A;    Family History  Problem Relation Age of Onset  . COPD Mother   . Asthma Mother    . Heart disease Father   . Hyperlipidemia Father     Social History   Socioeconomic History  . Marital status: Married    Spouse name: Not on file  . Number of children: Not on file  . Years of education: Not on file  . Highest education level: Not on file  Occupational History  . Not on file  Tobacco Use  . Smoking status: Former Smoker    Packs/day: 0.25    Years: 5.00    Pack years: 1.25    Types: Cigarettes  . Smokeless tobacco: Never Used  Substance and Sexual Activity  . Alcohol use: No    Alcohol/week: 0.0 standard drinks  . Drug use: No  . Sexual activity: Not on file  Other Topics Concern  . Not on file  Social History Narrative  . Not on file   Social Determinants of Health   Financial Resource Strain:   . Difficulty of Paying Living Expenses:   Food Insecurity:   . Worried About Charity fundraiser in the Last Year:   . Arboriculturist in the Last Year:   Transportation Needs:   . Film/video editor (Medical):   Marland Kitchen Lack of Transportation (Non-Medical):   Physical Activity:   . Days of Exercise per Week:   . Minutes of Exercise per Session:   Stress:   . Feeling  of Stress :   Social Connections:   . Frequency of Communication with Friends and Family:   . Frequency of Social Gatherings with Friends and Family:   . Attends Religious Services:   . Active Member of Clubs or Organizations:   . Attends Archivist Meetings:   Marland Kitchen Marital Status:   Intimate Partner Violence:   . Fear of Current or Ex-Partner:   . Emotionally Abused:   Marland Kitchen Physically Abused:   . Sexually Abused:     Outpatient Medications Prior to Visit  Medication Sig Dispense Refill  . dupilumab (DUPIXENT) 300 MG/2ML prefilled syringe Inject 300 mg into the skin every 14 (fourteen) days.    Marland Kitchen omeprazole (PRILOSEC) 40 MG capsule Take 1 capsule (40 mg total) by mouth every morning. 30 capsule 6  . rosuvastatin (CRESTOR) 20 MG tablet TAKE 1 TABLET BY MOUTH EVERY DAY AS  DIRECTED 90 tablet 2  . escitalopram (LEXAPRO) 10 MG tablet Take 1 tablet (10 mg total) by mouth daily. 30 tablet 3  . escitalopram (LEXAPRO) 20 MG tablet Take 20 mg by mouth daily.     No facility-administered medications prior to visit.    Allergies  Allergen Reactions  . Codeine Itching    ROS Review of Systems  Constitutional: Negative.   HENT: Negative.   Eyes: Negative.   Respiratory: Negative.   Cardiovascular: Negative.   Gastrointestinal: Negative.   Endocrine: Negative.   Genitourinary: Negative.   Musculoskeletal: Negative.   Neurological: Negative.   Psychiatric/Behavioral: Positive for dysphoric mood.      Objective:    Physical Exam  Constitutional: She is oriented to person, place, and time. She appears well-developed and well-nourished.  HENT:  Head: Normocephalic and atraumatic.  Eyes: Pupils are equal, round, and reactive to light. Conjunctivae and EOM are normal.  Cardiovascular: Normal rate, regular rhythm and normal heart sounds.  Pulmonary/Chest: Effort normal and breath sounds normal.  Musculoskeletal:        General: Normal range of motion.     Cervical back: Normal range of motion and neck supple.  Neurological: She is alert and oriented to person, place, and time. She has normal reflexes.  Vitals reviewed.   BP 122/70   Pulse 85   Temp 97.8 F (36.6 C)   Ht 5\' 2"  (1.575 m)   Wt 200 lb 9.6 oz (91 kg)   SpO2 97%   BMI 36.69 kg/m  Wt Readings from Last 3 Encounters:  09/11/19 200 lb 9.6 oz (91 kg)  08/30/19 199 lb 12.8 oz (90.6 kg)  02/16/16 199 lb 11.8 oz (90.6 kg)     Health Maintenance Due  Topic Date Due  . HIV Screening  Never done  . TETANUS/TDAP  Never done  . PAP SMEAR-Modifier  Never done  . MAMMOGRAM  Never done  . COLONOSCOPY  Never done    There are no preventive care reminders to display for this patient.  Lab Results  Component Value Date   TSH 0.856 08/30/2019   Lab Results  Component Value Date   WBC  5.8 08/30/2019   HGB 12.2 08/30/2019   HCT 35.8 08/30/2019   MCV 94 08/30/2019   PLT 239 08/30/2019   Lab Results  Component Value Date   NA 141 08/30/2019   K 4.2 08/30/2019   CO2 21 08/30/2019   GLUCOSE 98 08/30/2019   BUN 12 08/30/2019   CREATININE 0.75 08/30/2019   BILITOT 0.5 08/30/2019   ALKPHOS 71 08/30/2019  AST 23 08/30/2019   ALT 29 08/30/2019   PROT 7.0 08/30/2019   ALBUMIN 4.4 08/30/2019   CALCIUM 9.7 08/30/2019   ANIONGAP 9 02/17/2016   Lab Results  Component Value Date   CHOL 165 08/30/2019   Lab Results  Component Value Date   HDL 43 08/30/2019   Lab Results  Component Value Date   LDLCALC 80 08/30/2019   Lab Results  Component Value Date   TRIG 255 (H) 08/30/2019   Lab Results  Component Value Date   CHOLHDL 3.8 08/30/2019   No results found for: HGBA1C    Assessment & Plan:   Problem List Items Addressed This Visit      Other   Mixed hyperlipidemia    AN INDIVIDUAL CARE PLAN for hyperlipidemia/ cholesterol was established and reinforced today.  The patient's status was assessed using clinical findings on exam, lab and other diagnostic tests. The patient's disease status was assessed based on evidence-based guidelines and found to be good controlled. MEDICATIONS were reviewed. SELF MANAGEMENT GOALS have been discussed and patient's success at attaining the goal of low cholesterol was assessed. RECOMMENDATION given include regular exercise 3 days a week and low cholesterol/low fat diet. CLINICAL SUMMARY including written plan to identify barriers unique to the patient due to social or economic  reasons was discussed.      Adjustment disorder with depressed mood - Primary    Patient's depression is partially controlled with lexipro 10mg .   Anhedonia same.  PHQ 9 was was performed score 11. An individual care plan was established or reinforced today.  The patient's disease status was assessed using clinical findings on exam, labs, and or other  diagnostic testing to determine patient's success in meeting treatment goals based on disease specific evidence-based guidelines and found to be mild improvement Recommendations include increase lexapro to 20mg  a day and follow up one month.      Relevant Medications   escitalopram (LEXAPRO) 20 MG tablet      Meds ordered this encounter  Medications  . escitalopram (LEXAPRO) 20 MG tablet    Sig: Take 1 tablet (20 mg total) by mouth daily.    Dispense:  30 tablet    Refill:  3    Follow-up: Return in about 1 month (around 10/11/2019).    Reinaldo Meeker, MD

## 2019-09-11 NOTE — Assessment & Plan Note (Signed)
Patient's depression is partially controlled with lexipro 10mg .   Anhedonia same.  PHQ 9 was was performed score 11. An individual care plan was established or reinforced today.  The patient's disease status was assessed using clinical findings on exam, labs, and or other diagnostic testing to determine patient's success in meeting treatment goals based on disease specific evidence-based guidelines and found to be mild improvement Recommendations include increase lexapro to 20mg  a day and follow up one month.

## 2019-10-03 ENCOUNTER — Other Ambulatory Visit: Payer: Self-pay | Admitting: Legal Medicine

## 2019-10-03 DIAGNOSIS — F4321 Adjustment disorder with depressed mood: Secondary | ICD-10-CM

## 2019-10-10 ENCOUNTER — Encounter: Payer: Self-pay | Admitting: Legal Medicine

## 2019-10-10 ENCOUNTER — Other Ambulatory Visit: Payer: Self-pay

## 2019-10-10 ENCOUNTER — Ambulatory Visit (INDEPENDENT_AMBULATORY_CARE_PROVIDER_SITE_OTHER): Payer: 59 | Admitting: Legal Medicine

## 2019-10-10 VITALS — BP 118/64 | HR 81 | Temp 97.5°F | Ht 62.0 in | Wt 197.2 lb

## 2019-10-10 DIAGNOSIS — F5102 Adjustment insomnia: Secondary | ICD-10-CM

## 2019-10-10 DIAGNOSIS — F4321 Adjustment disorder with depressed mood: Secondary | ICD-10-CM | POA: Diagnosis not present

## 2019-10-10 DIAGNOSIS — Z6836 Body mass index (BMI) 36.0-36.9, adult: Secondary | ICD-10-CM

## 2019-10-10 DIAGNOSIS — Z6837 Body mass index (BMI) 37.0-37.9, adult: Secondary | ICD-10-CM | POA: Insufficient documentation

## 2019-10-10 MED ORDER — ZOLPIDEM TARTRATE 10 MG PO TABS: 10.0000 mg | ORAL_TABLET | Freq: Every evening | ORAL | 3 refills | Status: DC | PRN

## 2019-10-10 NOTE — Assessment & Plan Note (Addendum)
Patient's depression is controlled with lexapro.   Anhedonia better.  PHQ 9 was performed score 11. An individual care plan was established or reinforced today.  The patient's disease status was assessed using clinical findings on exam, labs, and or other diagnostic testing to determine patient's success in meeting treatment goals based on disease specific evidence-based guidelines and found to be improving Recommendations include stay on medicines.

## 2019-10-10 NOTE — Assessment & Plan Note (Signed)
AN INDIVIDUAL CARE PLAN was established and reinforced today.  The patient's status was assessed using clinical findings on exam, labs, and other diagnostic testing. Patient's success at meeting treatment goals based on disease specific evidence-bassed guidelines and found to be in good control. RECOMMENDATIONS include maintaining present medicines and treatment. 

## 2019-10-10 NOTE — Assessment & Plan Note (Addendum)
An individualize plan was formulated using patient history and physical exam to encourage weight loss.  An evidence based program was formulated.  Patient is to cut portion size with meals and to plan physical exercise 3 days a week at least 20 minutes.  Weight watchers and other programs are helpful.  Planned amount of weight loss 10 lbs. With hypertension and hyperlipidemia gives criteria or morbid obesity

## 2019-10-10 NOTE — Progress Notes (Signed)
Established Patient Office Visit  Subjective:  Patient ID: Madison Carey, female    DOB: 03/21/1971  Age: 49 y.o. MRN: [id number]  CC:  Chief Complaint  Patient presents with  . Adjustment disorder with depressed mood    HPI Madison Carey presents for depression  Patient presents for follow up of hypertension.  Patient tolerating diet well with side effects.  Patient was diagnosed with hypertension 2010 so has been treated for hypertension for 10 years.Patient is working on maintaining diet and exercise regimen and follows up as directed. Complication include none.  This patient has situational depression for 6 months.  PHQ9 =3.  Patient is having less anhedonia.  The patient has improved future plans and prospects.  The depression is worse with stress.  The patient is not exercising and working on behavior to improve mental health.  Patient is not seeing a therapist or psychiatrist.  na  Patient is on lexapro.  Past Medical History:  Diagnosis Date  . Adjustment insomnia   . GERD (gastroesophageal reflux disease)   . GERD (gastroesophageal reflux disease)   . High triglycerides   . History of frequent urinary tract infections   . Hypertension   . Impaired fasting glucose   . Insomnia   . Mixed hyperlipidemia   . Other vitamin B12 deficiency anemias   . Plantar fascial fibromatosis   . Right arm fracture   . Urinary incontinence   . Vitamin D deficiency     Past Surgical History:  Procedure Laterality Date  . ABDOMINAL HYSTERECTOMY    . bladder tacking     . CHOLECYSTECTOMY    . crevical ablation     . CYSTOSCOPY N/A 02/16/2016   Procedure: CYSTOSCOPY;  Surgeon: Bjorn Loser, MD;  Location: [facility];  Service: Urology;  Laterality: N/A;  . REVISION URINARY SLING N/A 02/16/2016   Procedure: REMOVAL URINARY SLING;  Surgeon: Bjorn Loser, MD;  Location: [facility];  Service: Urology;  Laterality: N/A;    Family History  Problem Relation Age of Onset  . COPD  Mother   . Asthma Mother   . Heart disease Father   . Hyperlipidemia Father     Social History   Socioeconomic History  . Marital status: Married    Spouse name: Not on file  . Number of children: Not on file  . Years of education: Not on file  . Highest education level: Not on file  Occupational History  . Not on file  Tobacco Use  . Smoking status: Former Smoker    Packs/day: 0.25    Years: 5.00    Pack years: 1.25    Types: Cigarettes  . Smokeless tobacco: Never Used  Substance and Sexual Activity  . Alcohol use: No    Alcohol/week: 0.0 standard drinks  . Drug use: No  . Sexual activity: Not on file  Other Topics Concern  . Not on file  Social History Narrative  . Not on file   Social Determinants of Health   Financial Resource Strain:   . Difficulty of Paying Living Expenses:   Food Insecurity:   . Worried About Charity fundraiser in the Last Year:   . Arboriculturist in the Last Year:   Transportation Needs:   . Film/video editor (Medical):   Marland Kitchen Lack of Transportation (Non-Medical):   Physical Activity:   . Days of Exercise per Week:   . Minutes of Exercise per Session:   Stress:   .  Feeling of Stress :   Social Connections:   . Frequency of Communication with Friends and Family:   . Frequency of Social Gatherings with Friends and Family:   . Attends Religious Services:   . Active Member of Clubs or Organizations:   . Attends Archivist Meetings:   Marland Kitchen Marital Status:   Intimate Partner Violence:   . Fear of Current or Ex-Partner:   . Emotionally Abused:   Marland Kitchen Physically Abused:   . Sexually Abused:     Outpatient Medications Prior to Visit  Medication Sig Dispense Refill  . zolpidem (AMBIEN) 10 MG tablet Take 10 mg by mouth at bedtime as needed.    . dupilumab (DUPIXENT) 300 MG/2ML prefilled syringe Inject 300 mg into the skin every 14 (fourteen) days.    Marland Kitchen escitalopram (LEXAPRO) 20 MG tablet TAKE 1 TABLET BY MOUTH EVERY DAY 90 tablet  2  . omeprazole (PRILOSEC) 40 MG capsule Take 1 capsule (40 mg total) by mouth every morning. 30 capsule 6  . rosuvastatin (CRESTOR) 20 MG tablet TAKE 1 TABLET BY MOUTH EVERY DAY AS DIRECTED 90 tablet 2   No facility-administered medications prior to visit.    Allergies  Allergen Reactions  . Codeine Itching    ROS Review of Systems  Constitutional: Negative.   HENT: Negative.   Eyes: Negative.   Respiratory: Negative.   Cardiovascular: Negative.   Gastrointestinal: Negative.   Endocrine: Negative.   Genitourinary: Negative.   Musculoskeletal: Negative.   Skin: Negative.   Allergic/Immunologic: Negative.   Neurological: Negative.   Psychiatric/Behavioral: Negative.       Objective:    Physical Exam  Constitutional: She appears well-developed and well-nourished.  HENT:  Head: Normocephalic and atraumatic.  Right Ear: External ear normal.  Left Ear: External ear normal.  Nose: Nose normal.  Mouth/Throat: Oropharynx is clear and moist.  Eyes: Pupils are equal, round, and reactive to light. Conjunctivae and EOM are normal.  Cardiovascular: Normal rate, regular rhythm, normal heart sounds and intact distal pulses.  Pulmonary/Chest: Effort normal and breath sounds normal.  Vitals reviewed.  Depression screen [facility] 2/9 09/11/2019 08/30/2019  Decreased Interest 1 2  Down, Depressed, Hopeless 2 2  PHQ - 2 Score 3 4  Altered sleeping 0 3  Tired, decreased energy 2 3  Change in appetite 3 3  Feeling bad or failure about yourself  2 1  Trouble concentrating 1 1  Moving slowly or fidgety/restless 0 0  Suicidal thoughts 0 0  PHQ-9 Score 11 15  Difficult doing work/chores - (No Data)   BP 118/64   Pulse 81   Temp (!) 97.5 F (36.4 C)   Ht 5\' 2"  (1.575 m)   Wt 197 lb 3.2 oz (89.4 kg)   SpO2 98%   BMI 36.07 kg/m  Wt Readings from Last 3 Encounters:  10/10/19 197 lb 3.2 oz (89.4 kg)  09/11/19 200 lb 9.6 oz (91 kg)  08/30/19 199 lb 12.8 oz (90.6 kg)     Health  Maintenance Due  Topic Date Due  . HIV Screening  Never done  . COVID-19 Vaccine (1) Never done  . TETANUS/TDAP  Never done  . PAP SMEAR-Modifier  Never done  . MAMMOGRAM  Never done  . COLONOSCOPY  Never done    There are no preventive care reminders to display for this patient.  Lab Results  Component Value Date   TSH 0.856 08/30/2019   Lab Results  Component Value Date   WBC  5.8 08/30/2019   HGB 12.2 08/30/2019   HCT 35.8 08/30/2019   MCV 94 08/30/2019   PLT 239 08/30/2019   Lab Results  Component Value Date   NA 141 08/30/2019   K 4.2 08/30/2019   CO2 21 08/30/2019   GLUCOSE 98 08/30/2019   BUN 12 08/30/2019   CREATININE 0.75 08/30/2019   BILITOT 0.5 08/30/2019   ALKPHOS 71 08/30/2019   AST 23 08/30/2019   ALT 29 08/30/2019   PROT 7.0 08/30/2019   ALBUMIN 4.4 08/30/2019   CALCIUM 9.7 08/30/2019   ANIONGAP 9 02/17/2016   Lab Results  Component Value Date   CHOL 165 08/30/2019   Lab Results  Component Value Date   HDL 43 08/30/2019   Lab Results  Component Value Date   LDLCALC 80 08/30/2019   Lab Results  Component Value Date   TRIG 255 (H) 08/30/2019   Lab Results  Component Value Date   CHOLHDL 3.8 08/30/2019   No results found for: HGBA1C    Assessment & Plan:   Problem List Items Addressed This Visit      Other   Adjustment insomnia - Primary    AN INDIVIDUAL CARE PLAN was established and reinforced today.  The patient's status was assessed using clinical findings on exam, labs, and other diagnostic testing. Patient's success at meeting treatment goals based on disease specific evidence-bassed guidelines and found to be in good control. RECOMMENDATIONS include maintaining present medicines and treatment.      Relevant Medications   zolpidem (AMBIEN) 10 MG tablet   Adjustment disorder with depressed mood    Patient's depression is controlled with lexapro.   Anhedonia better.  PHQ 9 was performed score 11. An individual care plan was  established or reinforced today.  The patient's disease status was assessed using clinical findings on exam, labs, and or other diagnostic testing to determine patient's success in meeting treatment goals based on disease specific evidence-based guidelines and found to be improving Recommendations include stay on medicines.      BMI 36.0-36.9,adult    An individualize plan was formulated using patient history and physical exam to encourage weight loss.  An evidence based program was formulated.  Patient is to cut portion size with meals and to plan physical exercise 3 days a week at least 20 minutes.  Weight watchers and other programs are helpful.  Planned amount of weight loss 10 lbs. With hypertension and hyperlipidemia gives criteria or morbid obesity         Meds ordered this encounter  Medications  . zolpidem (AMBIEN) 10 MG tablet    Sig: Take 1 tablet (10 mg total) by mouth at bedtime as needed.    Dispense:  30 tablet    Refill:  3    Follow-up: Return in about 3 months (around 01/10/2020) for fasting.    Reinaldo Meeker, MD

## 2019-12-18 ENCOUNTER — Encounter: Payer: Self-pay | Admitting: Legal Medicine

## 2019-12-18 LAB — IFOBT (OCCULT BLOOD): IFOBT: NEGATIVE

## 2020-01-07 ENCOUNTER — Ambulatory Visit: Payer: 59 | Admitting: Legal Medicine

## 2020-01-07 ENCOUNTER — Encounter: Payer: Self-pay | Admitting: Legal Medicine

## 2020-01-07 ENCOUNTER — Other Ambulatory Visit: Payer: Self-pay

## 2020-01-07 DIAGNOSIS — M7542 Impingement syndrome of left shoulder: Secondary | ICD-10-CM | POA: Diagnosis not present

## 2020-01-07 DIAGNOSIS — F5102 Adjustment insomnia: Secondary | ICD-10-CM

## 2020-01-07 MED ORDER — ZOLPIDEM TARTRATE 10 MG PO TABS: 10.0000 mg | ORAL_TABLET | Freq: Every evening | ORAL | 3 refills | Status: DC | PRN

## 2020-01-07 NOTE — Progress Notes (Signed)
Subjective:  Patient ID: Madison Carey, female    DOB: 1970-07-16  Age: 49 y.o. MRN: [id number]  Chief Complaint  Patient presents with  . Shoulder Pain    Pain on left shoulder since 3 months and ROM is limited    HPI: left shoulder pain for months.  She works with arms. Pain rotation and flexion pain.  No injury.  She pulls pallats.   Current Outpatient Medications on File Prior to Visit  Medication Sig Dispense Refill  . dupilumab (DUPIXENT) 300 MG/2ML prefilled syringe Inject 300 mg into the skin every 14 (fourteen) days.    . rosuvastatin (CRESTOR) 20 MG tablet TAKE 1 TABLET BY MOUTH EVERY DAY AS DIRECTED 90 tablet 2   No current facility-administered medications on file prior to visit.   Past Medical History:  Diagnosis Date  . Adjustment insomnia   . GERD (gastroesophageal reflux disease)   . GERD (gastroesophageal reflux disease)   . High triglycerides   . History of frequent urinary tract infections   . Hypertension   . Impaired fasting glucose   . Insomnia   . Mixed hyperlipidemia   . Other vitamin B12 deficiency anemias   . Plantar fascial fibromatosis   . Right arm fracture   . Urinary incontinence   . Vitamin D deficiency    Past Surgical History:  Procedure Laterality Date  . ABDOMINAL HYSTERECTOMY    . bladder tacking     . CHOLECYSTECTOMY    . crevical ablation     . CYSTOSCOPY N/A 02/16/2016   Procedure: CYSTOSCOPY;  Surgeon: Bjorn Loser, MD;  Location: [facility];  Service: Urology;  Laterality: N/A;  . REVISION URINARY SLING N/A 02/16/2016   Procedure: REMOVAL URINARY SLING;  Surgeon: Bjorn Loser, MD;  Location: [facility];  Service: Urology;  Laterality: N/A;    Family History  Problem Relation Age of Onset  . COPD Mother   . Asthma Mother   . Heart disease Father   . Hyperlipidemia Father    Social History   Socioeconomic History  . Marital status: Married    Spouse name: Not on file  . Number of children: 3  . Years of education:  Not on file  . Highest education level: Not on file  Occupational History  . Occupation: Engineer, building services  Tobacco Use  . Smoking status: Former Smoker    Packs/day: 0.25    Years: 5.00    Pack years: 1.25    Types: Cigarettes    Quit date: 2020    Years since quitting: 1.5  . Smokeless tobacco: Never Used  Vaping Use  . Vaping Use: Every day  . Substances: Nicotine, Flavoring  Substance and Sexual Activity  . Alcohol use: Yes    Alcohol/week: 2.0 standard drinks    Types: 2 Glasses of wine per week    Comment: occasionally  . Drug use: No  . Sexual activity: Yes    Partners: Male  Other Topics Concern  . Not on file  Social History Narrative  . Not on file   Social Determinants of Health   Financial Resource Strain:   . Difficulty of Paying Living Expenses:   Food Insecurity:   . Worried About Charity fundraiser in the Last Year:   . Arboriculturist in the Last Year:   Transportation Needs:   . Film/video editor (Medical):   Marland Kitchen Lack of Transportation (Non-Medical):   Physical Activity:   . Days of Exercise  per Week:   . Minutes of Exercise per Session:   Stress:   . Feeling of Stress :   Social Connections:   . Frequency of Communication with Friends and Family:   . Frequency of Social Gatherings with Friends and Family:   . Attends Religious Services:   . Active Member of Clubs or Organizations:   . Attends Archivist Meetings:   Marland Kitchen Marital Status:     Review of Systems  Constitutional: Negative.   HENT: Negative.   Eyes: Negative.   Respiratory: Negative.   Cardiovascular: Negative.   Gastrointestinal: Negative.   Genitourinary: Negative.   Musculoskeletal: Positive for arthralgias.  Neurological: Negative.   Psychiatric/Behavioral: Negative.      Objective:  BP 102/60 (BP Location: Right Arm, Patient Position: Sitting)   Pulse 81   Temp (!) 92 F (33.3 C) (Temporal)   Resp 17   Ht 5\' 3"  (1.6 m)   Wt 201 lb 6.4 oz (91.4 kg)    SpO2 92%   BMI 35.68 kg/m   BP/Weight 01/07/2020 12/09/1949 02/06/2670  Systolic BP 245 809 983  Diastolic BP 60 64 70  Wt. (Lbs) 201.4 197.2 200.6  BMI 35.68 36.07 36.69    Physical Exam Vitals reviewed.  Constitutional:      Appearance: Normal appearance.  HENT:     Head: Normocephalic and atraumatic.     Right Ear: Tympanic membrane normal.     Left Ear: Tympanic membrane normal.     Mouth/Throat:     Mouth: Mucous membranes are moist.  Cardiovascular:     Rate and Rhythm: Normal rate and regular rhythm.     Pulses: Normal pulses.     Heart sounds: Normal heart sounds.  Pulmonary:     Effort: Pulmonary effort is normal.     Breath sounds: Normal breath sounds.  Musculoskeletal:       Arms:     Cervical back: Normal range of motion.     Comments: Flexion 50 degrees, abduction 70 degrees, IR and ER 90 degrees.  Neurological:     Mental Status: She is alert.       Lab Results  Component Value Date   WBC 5.8 08/30/2019   HGB 12.2 08/30/2019   HCT 35.8 08/30/2019   PLT 239 08/30/2019   GLUCOSE 98 08/30/2019   CHOL 165 08/30/2019   TRIG 255 (H) 08/30/2019   HDL 43 08/30/2019   LDLCALC 80 08/30/2019   ALT 29 08/30/2019   AST 23 08/30/2019   NA 141 08/30/2019   K 4.2 08/30/2019   CL 108 (H) 08/30/2019   CREATININE 0.75 08/30/2019   BUN 12 08/30/2019   CO2 21 08/30/2019   TSH 0.856 08/30/2019      Assessment & Plan:   1. Adjustment insomnia - zolpidem (AMBIEN) 10 MG tablet; Take 1 tablet (10 mg total) by mouth at bedtime as needed.  Dispense: 30 tablet; Refill: 3 Patient has continued insomnia helped wih ambien 2. Impingement syndrome of shoulder region, left - AMB referral to orthopedics Patient has left shoulder impingement with some rotator cuff problems also, she requires orthopedic evaluation    Meds ordered this encounter  Medications  . zolpidem (AMBIEN) 10 MG tablet    Sig: Take 1 tablet (10 mg total) by mouth at bedtime as needed.     Dispense:  30 tablet    Refill:  3    Orders Placed This Encounter  Procedures  . AMB referral to orthopedics  Follow-up: Return if symptoms worsen or fail to improve.  An After Visit Summary was printed and given to the patient.  [place] [phone]

## 2020-01-07 NOTE — Patient Instructions (Signed)
Shoulder Exercises Ask your health care provider which exercises are safe for you. Do exercises exactly as told by your health care provider and adjust them as directed. It is normal to feel mild stretching, pulling, tightness, or discomfort as you do these exercises. Stop right away if you feel sudden pain or your pain gets worse. Do not begin these exercises until told by your health care provider. Stretching exercises External rotation and abduction This exercise is sometimes called corner stretch. This exercise rotates your arm outward (external rotation) and moves your arm out from your body (abduction). 1. Stand in a doorway with one of your feet slightly in front of the other. This is called a staggered stance. If you cannot reach your forearms to the door frame, stand facing a corner of a room. 2. Choose one of the following positions as told by your health care provider: ? Place your hands and forearms on the door frame above your head. ? Place your hands and forearms on the door frame at the height of your head. ? Place your hands on the door frame at the height of your elbows. 3. Slowly move your weight onto your front foot until you feel a stretch across your chest and in the front of your shoulders. Keep your head and chest upright and keep your abdominal muscles tight. 4. Hold for __________ seconds. 5. To release the stretch, shift your weight to your back foot. Repeat __________ times. Complete this exercise __________ times a day. Extension, standing 1. Stand and hold a broomstick, a cane, or a similar object behind your back. ? Your hands should be a little wider than shoulder width apart. ? Your palms should face away from your back. 2. Keeping your elbows straight and your shoulder muscles relaxed, move the stick away from your body until you feel a stretch in your shoulders (extension). ? Avoid shrugging your shoulders while you move the stick. Keep your shoulder blades tucked  down toward the middle of your back. 3. Hold for __________ seconds. 4. Slowly return to the starting position. Repeat __________ times. Complete this exercise __________ times a day. Range-of-motion exercises Pendulum  1. Stand near a wall or a surface that you can hold onto for balance. 2. Bend at the waist and let your left / right arm hang straight down. Use your other arm to support you. Keep your back straight and do not lock your knees. 3. Relax your left / right arm and shoulder muscles, and move your hips and your trunk so your left / right arm swings freely. Your arm should swing because of the motion of your body, not because you are using your arm or shoulder muscles. 4. Keep moving your hips and trunk so your arm swings in the following directions, as told by your health care provider: ? Side to side. ? Forward and backward. ? In clockwise and counterclockwise circles. 5. Continue each motion for __________ seconds, or for as long as told by your health care provider. 6. Slowly return to the starting position. Repeat __________ times. Complete this exercise __________ times a day. Shoulder flexion, standing  1. Stand and hold a broomstick, a cane, or a similar object. Place your hands a little more than shoulder width apart on the object. Your left / right hand should be palm up, and your other hand should be palm down. 2. Keep your elbow straight and your shoulder muscles relaxed. Push the stick up with your healthy arm to   raise your left / right arm in front of your body, and then over your head until you feel a stretch in your shoulder (flexion). ? Avoid shrugging your shoulder while you raise your arm. Keep your shoulder blade tucked down toward the middle of your back. 3. Hold for __________ seconds. 4. Slowly return to the starting position. Repeat __________ times. Complete this exercise __________ times a day. Shoulder abduction, standing 1. Stand and hold a broomstick,  a cane, or a similar object. Place your hands a little more than shoulder width apart on the object. Your left / right hand should be palm up, and your other hand should be palm down. 2. Keep your elbow straight and your shoulder muscles relaxed. Push the object across your body toward your left / right side. Raise your left / right arm to the side of your body (abduction) until you feel a stretch in your shoulder. ? Do not raise your arm above shoulder height unless your health care provider tells you to do that. ? If directed, raise your arm over your head. ? Avoid shrugging your shoulder while you raise your arm. Keep your shoulder blade tucked down toward the middle of your back. 3. Hold for __________ seconds. 4. Slowly return to the starting position. Repeat __________ times. Complete this exercise __________ times a day. Internal rotation  1. Place your left / right hand behind your back, palm up. 2. Use your other hand to dangle an exercise band, a towel, or a similar object over your shoulder. Grasp the band with your left / right hand so you are holding on to both ends. 3. Gently pull up on the band until you feel a stretch in the front of your left / right shoulder. The movement of your arm toward the center of your body is called internal rotation. ? Avoid shrugging your shoulder while you raise your arm. Keep your shoulder blade tucked down toward the middle of your back. 4. Hold for __________ seconds. 5. Release the stretch by letting go of the band and lowering your hands. Repeat __________ times. Complete this exercise __________ times a day. Strengthening exercises External rotation  1. Sit in a stable chair without armrests. 2. Secure an exercise band to a stable object at elbow height on your left / right side. 3. Place a soft object, such as a folded towel or a small pillow, between your left / right upper arm and your body to move your elbow about 4 inches (10 cm) away  from your side. 4. Hold the end of the exercise band so it is tight and there is no slack. 5. Keeping your elbow pressed against the soft object, slowly move your forearm out, away from your abdomen (external rotation). Keep your body steady so only your forearm moves. 6. Hold for __________ seconds. 7. Slowly return to the starting position. Repeat __________ times. Complete this exercise __________ times a day. Shoulder abduction  1. Sit in a stable chair without armrests, or stand up. 2. Hold a __________ weight in your left / right hand, or hold an exercise band with both hands. 3. Start with your arms straight down and your left / right palm facing in, toward your body. 4. Slowly lift your left / right hand out to your side (abduction). Do not lift your hand above shoulder height unless your health care provider tells you that this is safe. ? Keep your arms straight. ? Avoid shrugging your shoulder while you   do this movement. Keep your shoulder blade tucked down toward the middle of your back. 5. Hold for __________ seconds. 6. Slowly lower your arm, and return to the starting position. Repeat __________ times. Complete this exercise __________ times a day. Shoulder extension 1. Sit in a stable chair without armrests, or stand up. 2. Secure an exercise band to a stable object in front of you so it is at shoulder height. 3. Hold one end of the exercise band in each hand. Your palms should face each other. 4. Straighten your elbows and lift your hands up to shoulder height. 5. Step back, away from the secured end of the exercise band, until the band is tight and there is no slack. 6. Squeeze your shoulder blades together as you pull your hands down to the sides of your thighs (extension). Stop when your hands are straight down by your sides. Do not let your hands go behind your body. 7. Hold for __________ seconds. 8. Slowly return to the starting position. Repeat __________ times.  Complete this exercise __________ times a day. Shoulder row 1. Sit in a stable chair without armrests, or stand up. 2. Secure an exercise band to a stable object in front of you so it is at waist height. 3. Hold one end of the exercise band in each hand. Position your palms so that your thumbs are facing the ceiling (neutral position). 4. Bend each of your elbows to a 90-degree angle (right angle) and keep your upper arms at your sides. 5. Step back until the band is tight and there is no slack. 6. Slowly pull your elbows back behind you. 7. Hold for __________ seconds. 8. Slowly return to the starting position. Repeat __________ times. Complete this exercise __________ times a day. Shoulder press-ups  1. Sit in a stable chair that has armrests. Sit upright, with your feet flat on the floor. 2. Put your hands on the armrests so your elbows are bent and your fingers are pointing forward. Your hands should be about even with the sides of your body. 3. Push down on the armrests and use your arms to lift yourself off the chair. Straighten your elbows and lift yourself up as much as you comfortably can. ? Move your shoulder blades down, and avoid letting your shoulders move up toward your ears. ? Keep your feet on the ground. As you get stronger, your feet should support less of your body weight as you lift yourself up. 4. Hold for __________ seconds. 5. Slowly lower yourself back into the chair. Repeat __________ times. Complete this exercise __________ times a day. Wall push-ups  1. Stand so you are facing a stable wall. Your feet should be about one arm-length away from the wall. 2. Lean forward and place your palms on the wall at shoulder height. 3. Keep your feet flat on the floor as you bend your elbows and lean forward toward the wall. 4. Hold for __________ seconds. 5. Straighten your elbows to push yourself back to the starting position. Repeat __________ times. Complete this exercise  __________ times a day. This information is not intended to replace advice given to you by your health care provider. Make sure you discuss any questions you have with your health care provider. Document Revised: 09/14/2018 Document Reviewed: 06/22/2018 [employer] Patient Education  2020 [employer]  

## 2020-01-14 ENCOUNTER — Other Ambulatory Visit: Payer: Self-pay

## 2020-01-14 ENCOUNTER — Ambulatory Visit: Payer: 59 | Admitting: Legal Medicine

## 2020-01-14 ENCOUNTER — Encounter: Payer: Self-pay | Admitting: Legal Medicine

## 2020-01-14 VITALS — BP 92/50 | HR 77 | Temp 97.4°F | Resp 16 | Ht 62.0 in | Wt 198.8 lb

## 2020-01-14 DIAGNOSIS — F5102 Adjustment insomnia: Secondary | ICD-10-CM | POA: Diagnosis not present

## 2020-01-14 DIAGNOSIS — K760 Fatty (change of) liver, not elsewhere classified: Secondary | ICD-10-CM

## 2020-01-14 DIAGNOSIS — E782 Mixed hyperlipidemia: Secondary | ICD-10-CM

## 2020-01-14 DIAGNOSIS — Z6836 Body mass index (BMI) 36.0-36.9, adult: Secondary | ICD-10-CM

## 2020-01-14 DIAGNOSIS — F4321 Adjustment disorder with depressed mood: Secondary | ICD-10-CM | POA: Diagnosis not present

## 2020-01-14 DIAGNOSIS — K219 Gastro-esophageal reflux disease without esophagitis: Secondary | ICD-10-CM | POA: Diagnosis not present

## 2020-01-14 NOTE — Progress Notes (Signed)
Subjective:  Patient ID: Madison Carey, female    DOB: 1971-05-18  Age: 49 y.o. MRN: [id number]  Chief Complaint  Patient presents with  . Gastroesophageal Reflux  . Hyperlipidemia    HPI: chronic visit Patient has gastroesophageal reflux symptoms withesophagitis and LTRD.  The symptoms are moderate intensity.  Length of symptoms 10 years.  Medicines include none.  Complications include none.  Patient presents with hyperlipidemia.  Compliance with treatment has been good; patient takes medicines as directed, maintains low cholesterol diet, follows up as directed, and maintains exercise regimen.  Patient is using crestor without problems.   Current Outpatient Medications on File Prior to Visit  Medication Sig Dispense Refill  . dupilumab (DUPIXENT) 300 MG/2ML prefilled syringe Inject 300 mg into the skin every 14 (fourteen) days.    . rosuvastatin (CRESTOR) 20 MG tablet TAKE 1 TABLET BY MOUTH EVERY DAY AS DIRECTED 90 tablet 2  . zolpidem (AMBIEN) 10 MG tablet Take 1 tablet (10 mg total) by mouth at bedtime as needed. 30 tablet 3   No current facility-administered medications on file prior to visit.   Past Medical History:  Diagnosis Date  . Adjustment insomnia   . GERD (gastroesophageal reflux disease)   . Hypertension   . Impaired fasting glucose   . Mixed hyperlipidemia   . Other vitamin B12 deficiency anemias   . Plantar fascial fibromatosis   . Right arm fracture    Past Surgical History:  Procedure Laterality Date  . ABDOMINAL HYSTERECTOMY    . bladder tacking     . CHOLECYSTECTOMY    . crevical ablation     . CYSTOSCOPY N/A 02/16/2016   Procedure: CYSTOSCOPY;  Surgeon: Bjorn Loser, MD;  Location: [facility];  Service: Urology;  Laterality: N/A;  . REVISION URINARY SLING N/A 02/16/2016   Procedure: REMOVAL URINARY SLING;  Surgeon: Bjorn Loser, MD;  Location: [facility];  Service: Urology;  Laterality: N/A;    Family History  Problem Relation Age of Onset  .  COPD Mother   . Asthma Mother   . Heart disease Father   . Hyperlipidemia Father    Social History   Socioeconomic History  . Marital status: Married    Spouse name: Not on file  . Number of children: 3  . Years of education: Not on file  . Highest education level: Not on file  Occupational History  . Occupation: Engineer, building services  Tobacco Use  . Smoking status: Former Smoker    Packs/day: 0.25    Years: 5.00    Pack years: 1.25    Types: Cigarettes    Quit date: 2020    Years since quitting: 1.6  . Smokeless tobacco: Never Used  Vaping Use  . Vaping Use: Every day  . Substances: Nicotine, Flavoring  Substance and Sexual Activity  . Alcohol use: Yes    Alcohol/week: 2.0 standard drinks    Types: 2 Glasses of wine per week    Comment: occasionally  . Drug use: No  . Sexual activity: Yes    Partners: Male  Other Topics Concern  . Not on file  Social History Narrative  . Not on file   Social Determinants of Health   Financial Resource Strain:   . Difficulty of Paying Living Expenses:   Food Insecurity:   . Worried About Charity fundraiser in the Last Year:   . [place] in the Last Year:   Transportation Needs:   . Lack of  Transportation (Medical):   Marland Kitchen Lack of Transportation (Non-Medical):   Physical Activity:   . Days of Exercise per Week:   . Minutes of Exercise per Session:   Stress:   . Feeling of Stress :   Social Connections:   . Frequency of Communication with Friends and Family:   . Frequency of Social Gatherings with Friends and Family:   . Attends Religious Services:   . Active Member of Clubs or Organizations:   . Attends Archivist Meetings:   Marland Kitchen Marital Status:     Review of Systems  Constitutional: Negative.   HENT: Negative.   Eyes: Negative.   Respiratory: Negative.   Cardiovascular: Negative.   Gastrointestinal: Positive for abdominal pain.  Endocrine: Negative.   Genitourinary: Negative.   Musculoskeletal: Negative.     Neurological: Negative.   Psychiatric/Behavioral: Negative.      Objective:  BP (!) 92/50   Pulse 77   Temp (!) 97.4 F (36.3 C)   Resp 16   Ht 5\' 2"  (1.575 m)   Wt 198 lb 12.8 oz (90.2 kg)   SpO2 97%   BMI 36.36 kg/m   BP/Weight 01/14/2020 09/07/3152 0/0/8676  Systolic BP 92 195 093  Diastolic BP 50 60 64  Wt. (Lbs) 198.8 201.4 197.2  BMI 36.36 35.68 36.07    Physical Exam Vitals reviewed.  Constitutional:      Appearance: Normal appearance.  HENT:     Head: Normocephalic.     Right Ear: Tympanic membrane, ear canal and external ear normal.     Left Ear: Tympanic membrane, ear canal and external ear normal.     Nose: Nose normal.     Mouth/Throat:     Mouth: Mucous membranes are moist.     Pharynx: Oropharynx is clear.  Eyes:     Extraocular Movements: Extraocular movements intact.     Conjunctiva/sclera: Conjunctivae normal.     Pupils: Pupils are equal, round, and reactive to light.  Cardiovascular:     Rate and Rhythm: Normal rate and regular rhythm.     Pulses: Normal pulses.  Pulmonary:     Effort: Pulmonary effort is normal.     Breath sounds: Normal breath sounds.  Abdominal:     General: Abdomen is flat. Bowel sounds are normal.     Palpations: Abdomen is soft.     Comments: ruq fullness  Musculoskeletal:        General: Normal range of motion.     Cervical back: Normal range of motion.  Skin:    General: Skin is warm and dry.     Capillary Refill: Capillary refill takes less than 2 seconds.  Neurological:     General: No focal deficit present.     Mental Status: She is alert and oriented to person, place, and time.  Psychiatric:        Mood and Affect: Mood normal.        Thought Content: Thought content normal.        Judgment: Judgment normal.       Lab Results  Component Value Date   WBC 5.8 08/30/2019   HGB 12.2 08/30/2019   HCT 35.8 08/30/2019   PLT 239 08/30/2019   GLUCOSE 98 08/30/2019   CHOL 165 08/30/2019   TRIG 255 (H)  08/30/2019   HDL 43 08/30/2019   LDLCALC 80 08/30/2019   ALT 29 08/30/2019   AST 23 08/30/2019   NA 141 08/30/2019   K 4.2 08/30/2019  CL 108 (H) 08/30/2019   CREATININE 0.75 08/30/2019   BUN 12 08/30/2019   CO2 21 08/30/2019   TSH 0.856 08/30/2019      Assessment & Plan:    1. Mixed hyperlipidemia - CBC with Differential/Platelet - Lipid panel AN INDIVIDUAL CARE PLAN for hyperlipidemia/ cholesterol was established and reinforced today.  The patient's status was assessed using clinical findings on exam, lab and other diagnostic tests. The patient's disease status was assessed based on evidence-based guidelines and found to be well controlled. MEDICATIONS were reviewed. SELF MANAGEMENT GOALS have been discussed and patient's success at attaining the goal of low cholesterol was assessed. RECOMMENDATION given include regular exercise 3 days a week and low cholesterol/low fat diet. CLINICAL SUMMARY including written plan to identify barriers unique to the patient due to social or economic  reasons was discussed.  2. Adjustment disorder with depressed mood Patient's depression is controlled with none.   Anhedonia better.  PHQ 9 was performed score 3. An individual care plan was established or reinforced today.  The patient's disease status was assessed using clinical findings on exam, labs, and or other diagnostic testing to determine patient's success in meeting treatment goals based on disease specific evidence-based guidelines and found to be improving Recommendations include no new medicines  3. Adjustment insomnia AN INDIVIDUAL CARE PLAN for insomnia was established and reinforced today.  The patient's status was assessed using clinical findings on exam, labs, and other diagnostic testing. Patient's success at meeting treatment goals based on disease specific evidence-bassed guidelines and found to be in good control. RECOMMENDATIONS include maintaining present medicines and  treatment.  4. Gastroesophageal reflux disease without esophagitis Plan of care was formulated today.  She is doing well.  A plan of care was formulated using patient exam, tests and other sources to optimize care using evidence based information.  Recommend no smoking, no eating after supper, avoid fatty foods, elevate Head of bed, avoid tight fitting clothing.  Continue on otc.  5. Fatty liver - Comprehensive metabolic panel Chronic fatty liver with some fullness sensations  6. BMI 36.0-36.9,adult An individualize plan was formulated for obesity using patient history and physical exam to encourage weight loss.  An evidence based program was formulated.  Patient is to cut portion size with meals and to plan physical exercise 3 days a week at least 20 minutes.  Weight watchers and other programs are helpful.  Planned amount of weight loss 10 lbs.      Orders Placed This Encounter  Procedures  . CBC with Differential/Platelet  . Comprehensive metabolic panel  . Lipid panel     Follow-up: Return in about 6 months (around 07/16/2020) for fasting.  An After Visit Summary was printed and given to the patient.  [place] [phone]

## 2020-01-15 LAB — COMPREHENSIVE METABOLIC PANEL
ALT: 26 IU/L (ref 0–32)
AST: 23 IU/L (ref 0–40)
Albumin/Globulin Ratio: 1.7 (ref 1.2–2.2)
Albumin: 4.5 g/dL (ref 3.8–4.8)
Alkaline Phosphatase: 70 IU/L (ref 48–121)
BUN/Creatinine Ratio: 14 (ref 9–23)
BUN: 11 mg/dL (ref 6–24)
Bilirubin Total: 0.6 mg/dL (ref 0.0–1.2)
CO2: 22 mmol/L (ref 20–29)
Calcium: 9.3 mg/dL (ref 8.7–10.2)
Chloride: 105 mmol/L (ref 96–106)
Creatinine, Ser: 0.81 mg/dL (ref 0.57–1.00)
GFR calc Af Amer: 98 mL/min/{1.73_m2} (ref >59)
GFR calc non Af Amer: 85 mL/min/{1.73_m2} (ref >59)
Globulin, Total: 2.6 g/dL (ref 1.5–4.5)
Glucose: 103 mg/dL — ABNORMAL HIGH (ref 65–99)
Potassium: 4.3 mmol/L (ref 3.5–5.2)
Sodium: 140 mmol/L (ref 134–144)
Total Protein: 7.1 g/dL (ref 6.0–8.5)

## 2020-01-15 LAB — LIPID PANEL
Chol/HDL Ratio: 3.9 ratio (ref 0.0–4.4)
Cholesterol, Total: 154 mg/dL (ref 100–199)
HDL: 39 mg/dL — ABNORMAL LOW (ref >39)
LDL Chol Calc (NIH): 75 mg/dL (ref 0–99)
Triglycerides: 245 mg/dL — ABNORMAL HIGH (ref 0–149)
VLDL Cholesterol Cal: 40 mg/dL (ref 5–40)

## 2020-01-15 LAB — CBC WITH DIFFERENTIAL/PLATELET
Basophils Absolute: 0 10*3/uL (ref 0.0–0.2)
Basos: 1 %
EOS (ABSOLUTE): 0.1 10*3/uL (ref 0.0–0.4)
Eos: 1 %
Hematocrit: 36.6 % (ref 34.0–46.6)
Hemoglobin: 12 g/dL (ref 11.1–15.9)
Immature Grans (Abs): 0 10*3/uL (ref 0.0–0.1)
Immature Granulocytes: 1 %
Lymphocytes Absolute: 1.5 10*3/uL (ref 0.7–3.1)
Lymphs: 24 %
MCH: 31.7 pg (ref 26.6–33.0)
MCHC: 32.8 g/dL (ref 31.5–35.7)
MCV: 97 fL (ref 79–97)
Monocytes Absolute: 0.6 10*3/uL (ref 0.1–0.9)
Monocytes: 11 %
Neutrophils Absolute: 3.8 10*3/uL (ref 1.4–7.0)
Neutrophils: 62 %
Platelets: 232 10*3/uL (ref 150–450)
RBC: 3.79 x10E6/uL (ref 3.77–5.28)
RDW: 13.8 % (ref 11.7–15.4)
WBC: 6 10*3/uL (ref 3.4–10.8)

## 2020-01-15 LAB — CARDIOVASCULAR RISK ASSESSMENT

## 2020-01-15 NOTE — Progress Notes (Signed)
CBC normal, glucose 103, kidney tests normal, liver tests normal, triglyceride high watch diet lp

## 2020-02-03 ENCOUNTER — Telehealth (INDEPENDENT_AMBULATORY_CARE_PROVIDER_SITE_OTHER): Payer: 59 | Admitting: Legal Medicine

## 2020-02-03 ENCOUNTER — Encounter: Payer: Self-pay | Admitting: Legal Medicine

## 2020-02-03 DIAGNOSIS — J180 Bronchopneumonia, unspecified organism: Secondary | ICD-10-CM | POA: Insufficient documentation

## 2020-02-03 MED ORDER — PREDNISONE 10 MG (21) PO TBPK
ORAL_TABLET | ORAL | 0 refills | Status: DC
Start: 2020-02-03 — End: 2020-07-16

## 2020-02-03 MED ORDER — AZITHROMYCIN 250 MG PO TABS
ORAL_TABLET | ORAL | 0 refills | Status: DC
Start: 2020-02-03 — End: 2020-07-16

## 2020-02-03 NOTE — Progress Notes (Signed)
Virtual Visit via Telephone Note   This visit type was conducted due to national recommendations for restrictions regarding the COVID-19 Pandemic (e.g. social distancing) in an effort to limit this patient's exposure and mitigate transmission in our community.  Due to her co-morbid illnesses, this patient is at least at moderate risk for complications without adequate follow up.  This format is felt to be most appropriate for this patient at this time.  The patient did not have access to video technology/had technical difficulties with video requiring transitioning to audio format only (telephone).  All issues noted in this document were discussed and addressed.  No physical exam could be performed with this format.  Patient verbally consented to a telehealth visit.   Date:  02/03/2020   ID:  Madison Carey, DOB 1971/01/16, MRN [id number]  Patient Location: Home Provider Location: Office/Clinic  PCP:  Lillard Anes, MD   Evaluation Performed:  New Patient Evaluation  Chief Complaint:  Cough, congestion, no fever, started 4 days ago.  History of Present Illness:    Madison Carey is a 49 y.o. female with cough, congestion, no fever or chills, for 4 days.  No covid exposure.  The patient does not have symptoms concerning for COVID-19 infection (fever, chills, cough, or new shortness of breath).    Past Medical History:  Diagnosis Date  . Adjustment insomnia   . GERD (gastroesophageal reflux disease)   . Hypertension   . Impaired fasting glucose   . Mixed hyperlipidemia   . Other vitamin B12 deficiency anemias   . Plantar fascial fibromatosis   . Right arm fracture     Past Surgical History:  Procedure Laterality Date  . ABDOMINAL HYSTERECTOMY    . bladder tacking     . CHOLECYSTECTOMY    . crevical ablation     . CYSTOSCOPY N/A 02/16/2016   Procedure: CYSTOSCOPY;  Surgeon: Bjorn Loser, MD;  Location: [facility];  Service: Urology;  Laterality: N/A;  .  REVISION URINARY SLING N/A 02/16/2016   Procedure: REMOVAL URINARY SLING;  Surgeon: Bjorn Loser, MD;  Location: [facility];  Service: Urology;  Laterality: N/A;    Family History  Problem Relation Age of Onset  . COPD Mother   . Asthma Mother   . Heart disease Father   . Hyperlipidemia Father     Social History   Socioeconomic History  . Marital status: Married    Spouse name: Not on file  . Number of children: 3  . Years of education: Not on file  . Highest education level: Not on file  Occupational History  . Occupation: Engineer, building services  Tobacco Use  . Smoking status: Former Smoker    Packs/day: 0.25    Years: 5.00    Pack years: 1.25    Types: Cigarettes    Quit date: 2020    Years since quitting: 1.6  . Smokeless tobacco: Never Used  Vaping Use  . Vaping Use: Every day  . Substances: Nicotine, Flavoring  Substance and Sexual Activity  . Alcohol use: Yes    Alcohol/week: 2.0 standard drinks    Types: 2 Glasses of wine per week    Comment: occasionally  . Drug use: No  . Sexual activity: Yes    Partners: Male  Other Topics Concern  . Not on file  Social History Narrative  . Not on file   Social Determinants of Health   Financial Resource Strain:   . Difficulty of Paying Living Expenses: Not  on file  Food Insecurity:   . Worried About Charity fundraiser in the Last Year: Not on file  . Ran Out of Food in the Last Year: Not on file  Transportation Needs:   . Lack of Transportation (Medical): Not on file  . Lack of Transportation (Non-Medical): Not on file  Physical Activity:   . Days of Exercise per Week: Not on file  . Minutes of Exercise per Session: Not on file  Stress:   . Feeling of Stress : Not on file  Social Connections:   . Frequency of Communication with Friends and Family: Not on file  . Frequency of Social Gatherings with Friends and Family: Not on file  . Attends Religious Services: Not on file  . Active Member of Clubs or Organizations:  Not on file  . Attends Archivist Meetings: Not on file  . Marital Status: Not on file  Intimate Partner Violence:   . Fear of Current or Ex-Partner: Not on file  . Emotionally Abused: Not on file  . Physically Abused: Not on file  . Sexually Abused: Not on file    Outpatient Medications Prior to Visit  Medication Sig Dispense Refill  . dupilumab (DUPIXENT) 300 MG/2ML prefilled syringe Inject 300 mg into the skin every 14 (fourteen) days.    . rosuvastatin (CRESTOR) 20 MG tablet TAKE 1 TABLET BY MOUTH EVERY DAY AS DIRECTED 90 tablet 2  . zolpidem (AMBIEN) 10 MG tablet Take 1 tablet (10 mg total) by mouth at bedtime as needed. 30 tablet 3   No facility-administered medications prior to visit.   .med Allergies:   Codeine   Social History   Tobacco Use  . Smoking status: Former Smoker    Packs/day: 0.25    Years: 5.00    Pack years: 1.25    Types: Cigarettes    Quit date: 2020    Years since quitting: 1.6  . Smokeless tobacco: Never Used  Vaping Use  . Vaping Use: Every day  . Substances: Nicotine, Flavoring  Substance Use Topics  . Alcohol use: Yes    Alcohol/week: 2.0 standard drinks    Types: 2 Glasses of wine per week    Comment: occasionally  . Drug use: No     Review of Systems  Constitutional: Negative for chills and fever.  HENT: Positive for congestion and sore throat.   Eyes: Negative.   Respiratory: Positive for cough. Negative for shortness of breath and wheezing.   Cardiovascular: Positive for chest pain. Negative for palpitations.  Gastrointestinal: Negative.   Genitourinary: Negative.   Musculoskeletal: Negative.   Neurological: Negative.   Psychiatric/Behavioral: Negative.      Labs/Other Tests and Data Reviewed:    Recent Labs: 08/30/2019: TSH 0.856 01/14/2020: ALT 26; BUN 11; Creatinine, Ser 0.81; Hemoglobin 12.0; Platelets 232; Potassium 4.3; Sodium 140   Recent Lipid Panel Lab Results  Component Value Date/Time   CHOL 154  01/14/2020 08:06 AM   TRIG 245 (H) 01/14/2020 08:06 AM   HDL 39 (L) 01/14/2020 08:06 AM   CHOLHDL 3.9 01/14/2020 08:06 AM   LDLCALC 75 01/14/2020 08:06 AM    Wt Readings from Last 3 Encounters:  02/03/20 195 lb (88.5 kg)  01/14/20 198 lb 12.8 oz (90.2 kg)  01/07/20 201 lb 6.4 oz (91.4 kg)     Objective:    Vital Signs:  Temp 97.6 F (36.4 C)   Ht 5\' 2"  (1.575 m)   Wt 195 lb (88.5 kg)  BMI 35.67 kg/m    Physical Exam VS stable  ASSESSMENT & PLAN:   Diagnoses and all orders for this visit: Bronchial pneumonia Other orders -     azithromycin (ZITHROMAX) 250 MG tablet; 2 tablets on day 1, then 1 tablet daily on days 2-6. -     predniSONE (STERAPRED UNI-PAK 21 TAB) 10 MG (21) TBPK tablet; Take 6ills first day , then 5 pills day 2 and then cut down one pill day until gone Encopurage fluids, follow up if needed       COVID-19 Education: The signs and symptoms of COVID-19 were discussed with the patient and how to seek care for testing (follow up with PCP or arrange E-visit). The importance of social distancing was discussed today.  Time:   Today, I have spent 20 minutes with the patient with telehealth technology discussing the above problems.    Follow Up:  In Person prn  Signed, Reinaldo Meeker, MD  02/03/2020 1:23 PM    [place]

## 2020-02-06 ENCOUNTER — Other Ambulatory Visit: Payer: Self-pay | Admitting: Legal Medicine

## 2020-02-06 DIAGNOSIS — K219 Gastro-esophageal reflux disease without esophagitis: Secondary | ICD-10-CM

## 2020-02-07 ENCOUNTER — Other Ambulatory Visit: Payer: Self-pay | Admitting: Legal Medicine

## 2020-02-07 NOTE — Telephone Encounter (Signed)
I thought this was stopped in past.  Is she still needing this lp

## 2020-05-11 ENCOUNTER — Other Ambulatory Visit: Payer: Self-pay | Admitting: Legal Medicine

## 2020-05-11 DIAGNOSIS — E782 Mixed hyperlipidemia: Secondary | ICD-10-CM

## 2020-05-11 DIAGNOSIS — F4321 Adjustment disorder with depressed mood: Secondary | ICD-10-CM

## 2020-05-11 DIAGNOSIS — F5102 Adjustment insomnia: Secondary | ICD-10-CM

## 2020-06-24 ENCOUNTER — Telehealth (INDEPENDENT_AMBULATORY_CARE_PROVIDER_SITE_OTHER): Payer: 59 | Admitting: Legal Medicine

## 2020-06-24 ENCOUNTER — Other Ambulatory Visit: Payer: Self-pay

## 2020-06-24 ENCOUNTER — Encounter: Payer: Self-pay | Admitting: Legal Medicine

## 2020-06-24 DIAGNOSIS — J329 Chronic sinusitis, unspecified: Secondary | ICD-10-CM | POA: Insufficient documentation

## 2020-06-24 DIAGNOSIS — J019 Acute sinusitis, unspecified: Secondary | ICD-10-CM | POA: Diagnosis not present

## 2020-06-24 MED ORDER — AMOXICILLIN-POT CLAVULANATE 875-125 MG PO TABS: 1.0000 | ORAL_TABLET | Freq: Two times a day (BID) | ORAL | 0 refills | Status: DC

## 2020-06-24 NOTE — Progress Notes (Signed)
Virtual Visit via Video Note   This visit type was conducted due to national recommendations for restrictions regarding the COVID-19 Pandemic (e.g. social distancing) in an effort to limit this patient's exposure and mitigate transmission in our community.  Due to her co-morbid illnesses, this patient is at least at moderate risk for complications without adequate follow up.  This format is felt to be most appropriate for this patient at this time.  All issues noted in this document were discussed and addressed.  A limited physical exam was performed with this format.  A verbal consent was obtained for the virtual visit.   Date:  06/24/2020   ID:  Madison Carey, DOB Jan 31, 1971, MRN [id number]  Patient Location: Home Provider Location: Office/Clinic  PCP:  Lillard Anes, MD   Evaluation Performed:  New Patient Evaluation  Chief Complaint:  Cough, nasal congestion     History of Present Illness:    Madison Carey is a 50 y.o. female with Pt has been trying to treat with mucinex but it will not clear it up. She believes it is just a sinus infection "it happens every 6 months." Pt has not had Covid shots or flu shot. Pt denies fever. No one else in household sick, no known exposure. Had for 2 weeks.     The patient does have symptoms concerning for COVID-19 infection (fever, chills, cough, or new shortness of breath).    Past Medical History:  Diagnosis Date  . Adjustment insomnia   . GERD (gastroesophageal reflux disease)   . Hypertension   . Impaired fasting glucose   . Mixed hyperlipidemia   . Other vitamin B12 deficiency anemias   . Plantar fascial fibromatosis   . Right arm fracture     Past Surgical History:  Procedure Laterality Date  . ABDOMINAL HYSTERECTOMY    . bladder tacking     . CHOLECYSTECTOMY    . crevical ablation     . CYSTOSCOPY N/A 02/16/2016   Procedure: CYSTOSCOPY;  Surgeon: Bjorn Loser, MD;  Location: [facility];  Service: Urology;   Laterality: N/A;  . REVISION URINARY SLING N/A 02/16/2016   Procedure: REMOVAL URINARY SLING;  Surgeon: Bjorn Loser, MD;  Location: [facility];  Service: Urology;  Laterality: N/A;    Family History  Problem Relation Age of Onset  . COPD Mother   . Asthma Mother   . Heart disease Father   . Hyperlipidemia Father     Social History   Socioeconomic History  . Marital status: Married    Spouse name: Not on file  . Number of children: 3  . Years of education: Not on file  . Highest education level: Not on file  Occupational History  . Occupation: Engineer, building services  Tobacco Use  . Smoking status: Former Smoker    Packs/day: 0.25    Years: 5.00    Pack years: 1.25    Types: Cigarettes    Quit date: 2020    Years since quitting: 2.0  . Smokeless tobacco: Never Used  Vaping Use  . Vaping Use: Every day  . Substances: Nicotine, Flavoring  Substance and Sexual Activity  . Alcohol use: Yes    Alcohol/week: 2.0 standard drinks    Types: 2 Glasses of wine per week    Comment: occasionally  . Drug use: No  . Sexual activity: Yes    Partners: Male  Other Topics Concern  . Not on file  Social History Narrative  . Not on  file   Social Determinants of Health   Financial Resource Strain: Not on file  Food Insecurity: Not on file  Transportation Needs: Not on file  Physical Activity: Not on file  Stress: Not on file  Social Connections: Not on file  Intimate Partner Violence: Not on file    Outpatient Medications Prior to Visit  Medication Sig Dispense Refill  . azithromycin (ZITHROMAX) 250 MG tablet 2 tablets on day 1, then 1 tablet daily on days 2-6. 6 tablet 0  . dupilumab (DUPIXENT) 300 MG/2ML prefilled syringe Inject 300 mg into the skin every 14 (fourteen) days.    Marland Kitchen escitalopram (LEXAPRO) 20 MG tablet TAKE 1 TABLET BY MOUTH EVERY DAY 90 tablet 2  . omeprazole (PRILOSEC) 40 MG capsule TAKE 1 CAPSULE BY MOUTH EVERY DAY IN THE MORNING 90 capsule 2  . predniSONE (STERAPRED  UNI-PAK 21 TAB) 10 MG (21) TBPK tablet Take 6ills first day , then 5 pills day 2 and then cut down one pill day until gone 21 tablet 0  . rosuvastatin (CRESTOR) 20 MG tablet TAKE 1 TABLET BY MOUTH EVERY DAY AS DIRECTED 90 tablet 2  . zolpidem (AMBIEN) 10 MG tablet TAKE 1 TABLET (10 MG TOTAL) BY MOUTH AT BEDTIME AS NEEDED. 30 tablet 3   No facility-administered medications prior to visit.    Allergies:   Codeine   Social History   Tobacco Use  . Smoking status: Former Smoker    Packs/day: 0.25    Years: 5.00    Pack years: 1.25    Types: Cigarettes    Quit date: 2020    Years since quitting: 2.0  . Smokeless tobacco: Never Used  Vaping Use  . Vaping Use: Every day  . Substances: Nicotine, Flavoring  Substance Use Topics  . Alcohol use: Yes    Alcohol/week: 2.0 standard drinks    Types: 2 Glasses of wine per week    Comment: occasionally  . Drug use: No     Review of Systems  Constitutional: Negative for chills and fever.  HENT: Positive for congestion and sinus pain.   Eyes: Negative for redness.  Respiratory: Positive for cough.   Cardiovascular: Negative for chest pain and PND.  Gastrointestinal: Negative for melena.  Genitourinary: Negative for dysuria.  Musculoskeletal: Positive for myalgias.     Labs/Other Tests and Data Reviewed:    Recent Labs: 08/30/2019: TSH 0.856 01/14/2020: ALT 26; BUN 11; Creatinine, Ser 0.81; Hemoglobin 12.0; Platelets 232; Potassium 4.3; Sodium 140   Recent Lipid Panel Lab Results  Component Value Date/Time   CHOL 154 01/14/2020 08:06 AM   TRIG 245 (H) 01/14/2020 08:06 AM   HDL 39 (L) 01/14/2020 08:06 AM   CHOLHDL 3.9 01/14/2020 08:06 AM   LDLCALC 75 01/14/2020 08:06 AM    Wt Readings from Last 3 Encounters:  06/24/20 190 lb (86.2 kg)  02/03/20 195 lb (88.5 kg)  01/14/20 198 lb 12.8 oz (90.2 kg)     Objective:    Vital Signs:  Temp 97.8 F (36.6 C)   Wt 190 lb (86.2 kg)   BMI 34.75 kg/m    Physical Exam vs  stable  ASSESSMENT & PLAN:   Diagnoses and all orders for this visit: Acute non-recurrent sinusitis, unspecified location -     amoxicillin-clavulanate (AUGMENTIN) 875-125 MG tablet; Take 1 tablet by mouth 2 (two) times daily. I strongly recommended the patient get a COVID test especially since she is going to take a plane to go out of  town today.  She has not had any immunizations and has not had COVID.  She absolutely refuses to think that she could have COVID and will not come in.  I warned her about the contagiousness of this particular problem especially with the omicron       COVID-19 Education: The signs and symptoms of COVID-19 were discussed with the patient and how to seek care for testing (follow up with PCP or arrange E-visit). The importance of social distancing was discussed today.   I spent 15 minutes dedicated to the care of this patient on the date of this encounter to include face-to-face time with the patient, as well as:   Follow Up:  In Person prn  Signed, Alonna Minium, [facility]  06/24/2020 8:42 AM    [place]

## 2020-07-16 ENCOUNTER — Ambulatory Visit (INDEPENDENT_AMBULATORY_CARE_PROVIDER_SITE_OTHER): Payer: 59 | Admitting: Legal Medicine

## 2020-07-16 ENCOUNTER — Other Ambulatory Visit: Payer: Self-pay

## 2020-07-16 ENCOUNTER — Encounter: Payer: Self-pay | Admitting: Legal Medicine

## 2020-07-16 VITALS — BP 120/72 | HR 84 | Temp 97.5°F | Ht 63.0 in | Wt 209.0 lb

## 2020-07-16 DIAGNOSIS — E782 Mixed hyperlipidemia: Secondary | ICD-10-CM

## 2020-07-16 DIAGNOSIS — F4321 Adjustment disorder with depressed mood: Secondary | ICD-10-CM | POA: Diagnosis not present

## 2020-07-16 DIAGNOSIS — K219 Gastro-esophageal reflux disease without esophagitis: Secondary | ICD-10-CM

## 2020-07-16 DIAGNOSIS — F5102 Adjustment insomnia: Secondary | ICD-10-CM

## 2020-07-16 DIAGNOSIS — Z8616 Personal history of COVID-19: Secondary | ICD-10-CM

## 2020-07-16 MED ORDER — ZOLPIDEM TARTRATE 10 MG PO TABS: 10.0000 mg | ORAL_TABLET | Freq: Every evening | ORAL | 3 refills | Status: DC | PRN

## 2020-07-16 NOTE — Progress Notes (Signed)
Subjective:  Patient ID: Madison Carey, female    DOB: 12-Oct-1970  Age: 50 y.o. MRN: [id number]  Chief Complaint  Patient presents with  . Hyperlipidemia  . Hypertension  . Depression    HPI: chronic visit  Patient presents for follow up of hypertension.  Patient tolerating diet well with side effects.  Patient was diagnosed with hypertension 2010 so has been treated for hypertension for 10 years.Patient is working on maintaining diet and exercise regimen and follows up as directed. Complication include none.  Patient presents with hyperlipidemia.  Compliance with treatment has been good; patient takes medicines as directed, maintains low cholesterol diet, follows up as directed, and maintains exercise regimen.  Patient is using crestor without problems.  This patient has major depression for 58months.  PHQ9 =0.  Patient is having less anhedonia.  The patient has improving future plans and prospects.  The depression is worse with stress.  The patient is not exercising and working on behavior to improve mental health.  Patient is not seeing a therapist or psychiatrist.  none  Patient is on lexapro.   Current Outpatient Medications on File Prior to Visit  Medication Sig Dispense Refill  . dupilumab (DUPIXENT) 300 MG/2ML prefilled syringe Inject 300 mg into the skin every 14 (fourteen) days.    Marland Kitchen omeprazole (PRILOSEC) 40 MG capsule TAKE 1 CAPSULE BY MOUTH EVERY DAY IN THE MORNING 90 capsule 2  . rosuvastatin (CRESTOR) 20 MG tablet TAKE 1 TABLET BY MOUTH EVERY DAY AS DIRECTED 90 tablet 2   No current facility-administered medications on file prior to visit.   Past Medical History:  Diagnosis Date  . Adjustment insomnia   . GERD (gastroesophageal reflux disease)   . Hypertension   . Impaired fasting glucose   . Mixed hyperlipidemia   . Other vitamin B12 deficiency anemias   . Plantar fascial fibromatosis   . Right arm fracture    Past Surgical History:  Procedure Laterality Date   . ABDOMINAL HYSTERECTOMY    . bladder tacking     . CHOLECYSTECTOMY    . crevical ablation     . CYSTOSCOPY N/A 02/16/2016   Procedure: CYSTOSCOPY;  Surgeon: Bjorn Loser, MD;  Location: [facility];  Service: Urology;  Laterality: N/A;  . REVISION URINARY SLING N/A 02/16/2016   Procedure: REMOVAL URINARY SLING;  Surgeon: Bjorn Loser, MD;  Location: [facility];  Service: Urology;  Laterality: N/A;    Family History  Problem Relation Age of Onset  . COPD Mother   . Asthma Mother   . Heart disease Father   . Hyperlipidemia Father    Social History   Socioeconomic History  . Marital status: Married    Spouse name: Not on file  . Number of children: 3  . Years of education: Not on file  . Highest education level: Not on file  Occupational History  . Occupation: Engineer, building services  Tobacco Use  . Smoking status: Former Smoker    Packs/day: 0.25    Years: 5.00    Pack years: 1.25    Types: Cigarettes    Quit date: 2020    Years since quitting: 2.1  . Smokeless tobacco: Never Used  Vaping Use  . Vaping Use: Every day  . Substances: Nicotine, Flavoring  Substance and Sexual Activity  . Alcohol use: Yes    Alcohol/week: 2.0 standard drinks    Types: 2 Glasses of wine per week    Comment: occasionally  . Drug use: No  .  Sexual activity: Yes    Partners: Male  Other Topics Concern  . Not on file  Social History Narrative  . Not on file   Social Determinants of Health   Financial Resource Strain: Not on file  Food Insecurity: Not on file  Transportation Needs: Not on file  Physical Activity: Not on file  Stress: Not on file  Social Connections: Not on file    Review of Systems  Constitutional: Negative for activity change, appetite change and unexpected weight change.  HENT: Negative for congestion and sinus pain.   Eyes: Negative for visual disturbance.  Respiratory: Negative for chest tightness and shortness of breath.   Cardiovascular: Negative for chest pain and  leg swelling.  Gastrointestinal: Negative for abdominal distention and abdominal pain.  Endocrine: Negative for polyuria.  Genitourinary: Negative for difficulty urinating, dysuria and urgency.  Musculoskeletal: Negative.  Negative for arthralgias and back pain.  Skin: Negative.   Neurological: Negative.   Psychiatric/Behavioral: Negative.      Objective:  BP 120/72   Pulse 84   Temp (!) 97.5 F (36.4 C)   Ht 5\' 3"  (1.6 m)   Wt 209 lb (94.8 kg)   SpO2 99%   BMI 37.02 kg/m   BP/Weight 07/16/2020 06/24/2020 07/02/5168  Systolic BP 017 - -  Diastolic BP 72 - -  Wt. (Lbs) 209 190 195  BMI 37.02 34.75 35.67    Physical Exam Vitals reviewed.  Constitutional:      Appearance: Normal appearance.  HENT:     Head: Normocephalic and atraumatic.     Right Ear: Tympanic membrane, ear canal and external ear normal.     Left Ear: Tympanic membrane, ear canal and external ear normal.     Nose: Nose normal.     Mouth/Throat:     Mouth: Mucous membranes are moist.     Pharynx: Oropharynx is clear.  Eyes:     Extraocular Movements: Extraocular movements intact.     Conjunctiva/sclera: Conjunctivae normal.     Pupils: Pupils are equal, round, and reactive to light.  Cardiovascular:     Rate and Rhythm: Normal rate and regular rhythm.     Pulses: Normal pulses.     Heart sounds: No murmur heard. No gallop.   Pulmonary:     Effort: Pulmonary effort is normal. No respiratory distress.     Breath sounds: Normal breath sounds. No rales.  Abdominal:     General: Abdomen is flat. Bowel sounds are normal. There is no distension.     Palpations: Abdomen is soft.     Tenderness: There is no abdominal tenderness.  Musculoskeletal:        General: Normal range of motion.     Cervical back: Normal range of motion and neck supple.  Skin:    General: Skin is warm.     Capillary Refill: Capillary refill takes less than 2 seconds.  Neurological:     General: No focal deficit present.      Mental Status: She is alert and oriented to person, place, and time. Mental status is at baseline.  Psychiatric:        Mood and Affect: Mood normal.      Depression screen [facility] 2/9 07/16/2020 09/11/2019 08/30/2019  Decreased Interest - 1 2  Down, Depressed, Hopeless 0 2 2  PHQ - 2 Score 0 3 4  Altered sleeping 0 0 3  Tired, decreased energy 0 2 3  Change in appetite 0 3 3  Feeling  bad or failure about yourself  0 2 1  Trouble concentrating 0 1 1  Moving slowly or fidgety/restless 0 0 0  Suicidal thoughts 0 0 0  PHQ-9 Score 0 11 15  Difficult doing work/chores Not difficult at all - (No Data)     Lab Results  Component Value Date   WBC 6.0 01/14/2020   HGB 12.0 01/14/2020   HCT 36.6 01/14/2020   PLT 232 01/14/2020   GLUCOSE 103 (H) 01/14/2020   CHOL 154 01/14/2020   TRIG 245 (H) 01/14/2020   HDL 39 (L) 01/14/2020   LDLCALC 75 01/14/2020   ALT 26 01/14/2020   AST 23 01/14/2020   NA 140 01/14/2020   K 4.3 01/14/2020   CL 105 01/14/2020   CREATININE 0.81 01/14/2020   BUN 11 01/14/2020   CO2 22 01/14/2020   TSH 0.856 08/30/2019      Assessment & Plan:   1. Gastroesophageal reflux disease without esophagitis - CBC with Differential/Platelet - Comprehensive metabolic panel Plan of care was formulated today.  She is doing well.  A plan of care was formulated using patient exam, tests and other sources to optimize care using evidence based information.  Recommend no smoking, no eating after supper, avoid fatty foods, elevate Head of bed, avoid tight fitting clothing.  Continue on omeprazole.  2. Mixed hyperlipidemia - CBC with Differential/Platelet - Comprehensive metabolic panel - Lipid panel AN INDIVIDUAL CARE PLAN for hyperlipidemia/ cholesterol was established and reinforced today.  The patient's status was assessed using clinical findings on exam, lab and other diagnostic tests. The patient's disease status was assessed based on evidence-based guidelines and found to  be well controlled. MEDICATIONS were reviewed. SELF MANAGEMENT GOALS have been discussed and patient's success at attaining the goal of low cholesterol was assessed. RECOMMENDATION given include regular exercise 3 days a week and low cholesterol/low fat diet. CLINICAL SUMMARY including written plan to identify barriers unique to the patient due to social or economic  reasons was discussed.  3. Adjustment disorder with depressed mood Patient's depression is controled with none.   Anhedonia better.  PHQ 9 was performed score 11. An individual care plan was established or reinforced today.  The patient's disease status was assessed using clinical findings on exam, labs, and or other diagnostic testing to determine patient's success in meeting treatment goals based on disease specific evidence-based guidelines and found to be improving Recommendations include stay off medicine  4. Adjustment insomnia - zolpidem (AMBIEN) 10 MG tablet; Take 1 tablet (10 mg total) by mouth at bedtime as needed.  Dispense: 30 tablet; Refill: 3 Continue zolpidem PRN    Meds ordered this encounter  Medications  . zolpidem (AMBIEN) 10 MG tablet    Sig: Take 1 tablet (10 mg total) by mouth at bedtime as needed.    Dispense:  30 tablet    Refill:  3    Not to exceed 2 additional fills before 07/05/2020    Orders Placed This Encounter  Procedures  . CBC with Differential/Platelet  . Comprehensive metabolic panel  . Lipid panel  . SARS-CoV-2 Antibodies      I spent 30 minutes dedicated to the care of this patient on the date of this encounter to include face-to-face time with the patient, as well as:   Follow-up: Return in about 6 months (around 01/13/2021) for fasting.  An After Visit Summary was printed and given to the patient.  Reinaldo Meeker, MD [facility] [phone]

## 2020-07-17 LAB — COMPREHENSIVE METABOLIC PANEL
ALT: 29 IU/L (ref 0–32)
AST: 21 IU/L (ref 0–40)
Albumin/Globulin Ratio: 1.8 (ref 1.2–2.2)
Albumin: 4.6 g/dL (ref 3.8–4.9)
Alkaline Phosphatase: 78 IU/L (ref 44–121)
BUN/Creatinine Ratio: 15 (ref 9–23)
BUN: 11 mg/dL (ref 6–24)
Bilirubin Total: 0.5 mg/dL (ref 0.0–1.2)
CO2: 18 mmol/L — ABNORMAL LOW (ref 20–29)
Calcium: 9.8 mg/dL (ref 8.7–10.2)
Chloride: 104 mmol/L (ref 96–106)
Creatinine, Ser: 0.74 mg/dL (ref 0.57–1.00)
GFR calc Af Amer: 108 mL/min/{1.73_m2} (ref >59)
GFR calc non Af Amer: 94 mL/min/{1.73_m2} (ref >59)
Globulin, Total: 2.5 g/dL (ref 1.5–4.5)
Glucose: 128 mg/dL — ABNORMAL HIGH (ref 65–99)
Potassium: 4.2 mmol/L (ref 3.5–5.2)
Sodium: 139 mmol/L (ref 134–144)
Total Protein: 7.1 g/dL (ref 6.0–8.5)

## 2020-07-17 LAB — CBC WITH DIFFERENTIAL/PLATELET
Basophils Absolute: 0 10*3/uL (ref 0.0–0.2)
Basos: 0 %
EOS (ABSOLUTE): 0.2 10*3/uL (ref 0.0–0.4)
Eos: 2 %
Hematocrit: 37.3 % (ref 34.0–46.6)
Hemoglobin: 12.8 g/dL (ref 11.1–15.9)
Immature Grans (Abs): 0.1 10*3/uL (ref 0.0–0.1)
Immature Granulocytes: 1 %
Lymphocytes Absolute: 1.6 10*3/uL (ref 0.7–3.1)
Lymphs: 23 %
MCH: 31 pg (ref 26.6–33.0)
MCHC: 34.3 g/dL (ref 31.5–35.7)
MCV: 90 fL (ref 79–97)
Monocytes Absolute: 0.6 10*3/uL (ref 0.1–0.9)
Monocytes: 8 %
Neutrophils Absolute: 4.6 10*3/uL (ref 1.4–7.0)
Neutrophils: 66 %
Platelets: 272 10*3/uL (ref 150–450)
RBC: 4.13 x10E6/uL (ref 3.77–5.28)
RDW: 13.8 % (ref 11.7–15.4)
WBC: 7.1 10*3/uL (ref 3.4–10.8)

## 2020-07-17 LAB — LIPID PANEL
Chol/HDL Ratio: 4.1 ratio (ref 0.0–4.4)
Cholesterol, Total: 173 mg/dL (ref 100–199)
HDL: 42 mg/dL (ref >39)
LDL Chol Calc (NIH): 79 mg/dL (ref 0–99)
Triglycerides: 324 mg/dL — ABNORMAL HIGH (ref 0–149)
VLDL Cholesterol Cal: 52 mg/dL — ABNORMAL HIGH (ref 5–40)

## 2020-07-17 LAB — CARDIOVASCULAR RISK ASSESSMENT

## 2020-07-17 LAB — SARS-COV-2 ANTIBODIES: SARS-CoV-2 Antibodies: POSITIVE

## 2020-07-17 NOTE — Progress Notes (Signed)
Yes can add A1c due to hyperglycemia lp

## 2020-07-17 NOTE — Progress Notes (Signed)
Cbc normal, glucose 128, kidney tests normal, liver tests normal, triglycerides very high 324- consider vascepa to lower CV risk. lp

## 2020-07-17 NOTE — Progress Notes (Signed)
Antibodies are positive to Covid (SARS-Cov-2) lp

## 2020-07-20 LAB — SARS-COV-2 SEMI-QUANTITATIVE TOTAL ANTIBODY, SPIKE
SARS-CoV-2 Semi-Quant Total Ab: >2500 U/mL (ref <0.8)
SARS-CoV-2 Spike Ab Interp: POSITIVE

## 2020-07-20 LAB — HGB A1C W/O EAG: Hgb A1c MFr Bld: 4.8 % (ref 4.8–5.6)

## 2020-07-20 LAB — SPECIMEN STATUS REPORT

## 2020-07-20 NOTE — Progress Notes (Signed)
Covid antibodies >2500 lp

## 2020-08-26 ENCOUNTER — Encounter: Payer: Self-pay | Admitting: Legal Medicine

## 2020-08-26 ENCOUNTER — Ambulatory Visit: Payer: 59 | Admitting: Legal Medicine

## 2020-08-26 ENCOUNTER — Other Ambulatory Visit: Payer: Self-pay

## 2020-08-26 VITALS — BP 128/68 | HR 91 | Temp 96.8°F | Ht 63.0 in | Wt 207.0 lb

## 2020-08-26 DIAGNOSIS — E782 Mixed hyperlipidemia: Secondary | ICD-10-CM | POA: Diagnosis not present

## 2020-08-26 DIAGNOSIS — R739 Hyperglycemia, unspecified: Secondary | ICD-10-CM | POA: Diagnosis not present

## 2020-08-26 NOTE — Progress Notes (Signed)
Subjective:  Patient ID: Madison Carey, female    DOB: 1970/10/21  Age: 50 y.o. MRN: [id number]  Chief Complaint  Patient presents with  . Referral Needed    HPI: CHRONIC VISIT  Patient went to [facility] and was told her insulin was high.  Her last glucose here was 124 and A1c was 4.8.  She is now on a glucose free diet. She has not had hypoglycemia or Dm in the past.  BMI 36. She wants to see [facility] endocrinology only for evaluation.   Current Outpatient Medications on File Prior to Visit  Medication Sig Dispense Refill  . dupilumab (DUPIXENT) 300 MG/2ML prefilled syringe Inject 300 mg into the skin every 14 (fourteen) days.    Marland Kitchen omeprazole (PRILOSEC) 40 MG capsule TAKE 1 CAPSULE BY MOUTH EVERY DAY IN THE MORNING 90 capsule 2  . rosuvastatin (CRESTOR) 20 MG tablet TAKE 1 TABLET BY MOUTH EVERY DAY AS DIRECTED 90 tablet 2  . zolpidem (AMBIEN) 10 MG tablet Take 1 tablet (10 mg total) by mouth at bedtime as needed. 30 tablet 3   No current facility-administered medications on file prior to visit.   Past Medical History:  Diagnosis Date  . Adjustment insomnia   . GERD (gastroesophageal reflux disease)   . Hypertension   . Impaired fasting glucose   . Mixed hyperlipidemia   . Other vitamin B12 deficiency anemias   . Plantar fascial fibromatosis   . Right arm fracture    Past Surgical History:  Procedure Laterality Date  . ABDOMINAL HYSTERECTOMY    . bladder tacking     . CHOLECYSTECTOMY    . crevical ablation     . CYSTOSCOPY N/A 02/16/2016   Procedure: CYSTOSCOPY;  Surgeon: Bjorn Loser, MD;  Location: [facility];  Service: Urology;  Laterality: N/A;  . REVISION URINARY SLING N/A 02/16/2016   Procedure: REMOVAL URINARY SLING;  Surgeon: Bjorn Loser, MD;  Location: [facility];  Service: Urology;  Laterality: N/A;    Family History  Problem Relation Age of Onset  . COPD Mother   . Asthma Mother   . Heart disease Father   . Hyperlipidemia Father    Social  History   Socioeconomic History  . Marital status: Married    Spouse name: Not on file  . Number of children: 3  . Years of education: Not on file  . Highest education level: Not on file  Occupational History  . Occupation: Engineer, building services  Tobacco Use  . Smoking status: Former Smoker    Packs/day: 0.25    Years: 5.00    Pack years: 1.25    Types: Cigarettes    Quit date: 2020    Years since quitting: 2.2  . Smokeless tobacco: Never Used  Vaping Use  . Vaping Use: Every day  . Substances: Nicotine, Flavoring  Substance and Sexual Activity  . Alcohol use: Yes    Alcohol/week: 2.0 standard drinks    Types: 2 Glasses of wine per week    Comment: occasionally  . Drug use: No  . Sexual activity: Yes    Partners: Male  Other Topics Concern  . Not on file  Social History Narrative  . Not on file   Social Determinants of Health   Financial Resource Strain: Not on file  Food Insecurity: Not on file  Transportation Needs: Not on file  Physical Activity: Not on file  Stress: Not on file  Social Connections: Not on file  Review of Systems  Constitutional: Negative for chills, fatigue and fever.  HENT: Negative for congestion, ear pain, rhinorrhea and sore throat.   Eyes: Negative for visual disturbance.  Respiratory: Negative for cough and shortness of breath.   Cardiovascular: Negative for chest pain.  Gastrointestinal: Negative for abdominal distention and abdominal pain.  Endocrine: Negative for polyuria.       On 1800 cal diet  Genitourinary: Negative for dyspareunia, dysuria and urgency.  Musculoskeletal: Negative for arthralgias and back pain.  Skin: Negative.   Neurological: Negative.  Negative for dizziness, numbness and headaches.  Psychiatric/Behavioral: Negative.      Objective:  BP 128/68   Pulse 91   Temp (!) 96.8 F (36 C)   Ht 5\' 3"  (1.6 m)   Wt 207 lb (93.9 kg)   SpO2 99%   BMI 36.67 kg/m   BP/Weight 08/26/2020 07/16/2020 9/56/2130  Systolic  BP 865 784 -  Diastolic BP 68 72 -  Wt. (Lbs) 207 209 190  BMI 36.67 37.02 34.75    Physical Exam Vitals reviewed.  Constitutional:      Appearance: Normal appearance. She is obese.  HENT:     Head: Normocephalic.     Right Ear: Tympanic membrane, ear canal and external ear normal.     Left Ear: Tympanic membrane, ear canal and external ear normal.     Nose: Nose normal.     Mouth/Throat:     Mouth: Mucous membranes are dry.     Pharynx: Oropharynx is clear.  Eyes:     Extraocular Movements: Extraocular movements intact.     Conjunctiva/sclera: Conjunctivae normal.     Pupils: Pupils are equal, round, and reactive to light.  Cardiovascular:     Rate and Rhythm: Normal rate and regular rhythm.     Pulses: Normal pulses.     Heart sounds: Normal heart sounds. No murmur heard. No gallop.   Pulmonary:     Effort: Pulmonary effort is normal. No respiratory distress.     Breath sounds: Normal breath sounds. No rales.  Abdominal:     General: Abdomen is flat. Bowel sounds are normal. There is no distension.     Palpations: Abdomen is soft.     Tenderness: There is no abdominal tenderness.  Musculoskeletal:        General: Normal range of motion.     Cervical back: Normal range of motion.  Skin:    General: Skin is warm and dry.     Capillary Refill: Capillary refill takes less than 2 seconds.  Neurological:     General: No focal deficit present.     Mental Status: She is alert and oriented to person, place, and time. Mental status is at baseline.  Psychiatric:        Mood and Affect: Mood normal.        Behavior: Behavior normal.        Thought Content: Thought content normal.        Judgment: Judgment normal.       Lab Results  Component Value Date   WBC 7.1 07/16/2020   HGB 12.8 07/16/2020   HCT 37.3 07/16/2020   PLT 272 07/16/2020   GLUCOSE 128 (H) 07/16/2020   CHOL 173 07/16/2020   TRIG 324 (H) 07/16/2020   HDL 42 07/16/2020   LDLCALC 79 07/16/2020   ALT  29 07/16/2020   AST 21 07/16/2020   NA 139 07/16/2020   K 4.2 07/16/2020   CL 104 07/16/2020  CREATININE 0.74 07/16/2020   BUN 11 07/16/2020   CO2 18 (L) 07/16/2020   TSH 0.856 08/30/2019   HGBA1C 4.8 07/16/2020      Assessment & Plan:   Diagnoses and all orders for this visit: Hyperglycemia -     Ambulatory referral to Endocrinology -     Comprehensive metabolic panel -     Hemoglobin A1c -     Insulin and C-Peptide Patient has a history of hyperglycemia but normal A1c.  She is obese and has hypercholesterolemia both signs of metabolic syndrome with a degree of insulin resistance.  We had a long talk on this.  I advised her the best way to manage this insulin resistance is weight loss  Mixed hyperlipidemia AN INDIVIDUAL CARE PLAN for hyperlipidemia/ cholesterol was established and reinforced today.  The patient's status was assessed using clinical findings on exam, lab and other diagnostic tests. The patient's disease status was assessed based on evidence-based guidelines and found to be well controlled. MEDICATIONS were reviewed. SELF MANAGEMENT GOALS have been discussed and patient's success at attaining the goal of low cholesterol was assessed. RECOMMENDATION given include regular exercise 3 days a week and low cholesterol/low fat diet. CLINICAL SUMMARY including written plan to identify barriers unique to the patient due to social or economic  reasons was discussed.         I spent 25 minutes dedicated to the care of this patient on the date of this encounter to include face-to-face time with the patient, as well as: review records  Follow-up: Return in about 3 months (around 11/26/2020).  An After Visit Summary was printed and given to the patient.  Reinaldo Meeker, MD [facility] [phone]

## 2020-08-26 NOTE — Patient Instructions (Signed)
Metabolic Syndrome Metabolic syndrome occurs when you have a combination of three or more factors that increase your chances of developing cardiovascular disease and diabetes. These factors include:  High fasting blood sugar (glucose).  High blood triglyceride level.  High blood pressure.  Low levels of high-density lipoprotein (HDL) blood cholesterol.  Having a waist measurement that is: ? More than 40 inches in men. ? More than 35 inches in women. Metabolic syndrome is sometimes called insulin resistance syndrome or syndrome X. What are the causes? The exact cause of this condition is not known. It may be related to a combination of the factors that were passed down from your parents (genes) and the things that you do, eat, and drink (lifestyle choices). What increases the risk? You are more likely to develop this condition if you:  Eat a diet that is high in calories and saturated fat.  Do not exercise regularly.  Are obese.  Have a family history of type 2 diabetes mellitus.  Have insulin resistance.  Have a history of gestational diabetes during a previous pregnancy.  Have conditions such as cardiovascular disease, nonalcoholic fatty liver disease, or polycystic ovary syndrome (PCOS).  Are older. The risk increases with age.  Use any tobacco products, including cigarettes, chewing tobacco, or e-cigarettes. What are the signs or symptoms? Metabolic syndrome has no specific symptoms. Having abnormal blood test results may be the only sign of metabolic syndrome. How is this diagnosed? This condition may be diagnosed based on:  Your blood pressure measurements.  Your waist measurement.  Blood tests.  Your personal and family medical history. How is this treated? Treatment may include:  Lifestyle changes to reduce your risk for heart disease, stroke, and diabetes. These include: ? Exercise. ? Weight loss. ? Eating a healthy diet. ? Stopping tobacco and nicotine  use.  Medicines that: ? Help your body maintain normal blood glucose levels. ? Lower your blood pressure and your blood triglyceride levels. You may be referred to a behavioral counselor who will help you make lifestyle changes that will lower your risk for serious disease. Follow these instructions at home: Lifestyle  Exercise regularly, as told by your health care provider.  Eat a healthy diet that includes fresh fruits and vegetables, whole grains, lean proteins, and low-fat or nonfat dairy products.  Do not use any products that contain nicotine or tobacco, such as cigarettes, e-cigarettes, and chewing tobacco. If you need help quitting, ask your health care provider.  Maintain a healthy weight. Work with your health care provider to lose weight safely, if needed.      General instructions  Take over-the-counter and prescription medicines only as told by your health care provider.  If directed, measure your waist regularly and write down the measurements. To measure your waist: ? Stand up straight. ? Breathe out. ? Wrap a measuring tape around the part of your waist that is just above your hip bones. ? Read and write down the measurement.  Keep all follow-up visits. This is important. Contact a health care provider if:  You feel very tired.  You are extremely thirsty.  You urinate a lot more than usual.  Your waist gets bigger.  You have headaches that do not go away. Get help right away if:  You suddenly develop any of the following: ? Dizziness. ? Blurry vision. ? Trouble speaking. ? Trouble swallowing. ? Weakness in an arm or leg. ? Chest pain. ? Trouble breathing.  Your heartbeat feels abnormal.  You  faint. Summary  Metabolic syndrome occurs when you have a combination of three or more factors that increase your chances of developing cardiovascular disease and diabetes.  These factors include high fasting blood sugar (glucose), high blood triglyceride  level, high blood pressure, low levels of high-density lipoprotein (HDL) blood cholesterol, and a waist measurement that is more than 40 inches in men or more than 35 inches in women.  Metabolic syndrome has no specific symptoms. Having abnormal blood test results may be the only signs of metabolic syndrome.  Treatment may include lifestyle changes and medicines to reduce your risk for heart disease, stroke, and diabetes. This information is not intended to replace advice given to you by your health care provider. Make sure you discuss any questions you have with your health care provider. Document Revised: 10/14/2019 Document Reviewed: 10/14/2019 Elsevier Patient Education  [employer].

## 2020-08-27 LAB — COMPREHENSIVE METABOLIC PANEL
ALT: 30 IU/L (ref 0–32)
AST: 26 IU/L (ref 0–40)
Albumin/Globulin Ratio: 1.7 (ref 1.2–2.2)
Albumin: 4.5 g/dL (ref 3.8–4.9)
Alkaline Phosphatase: 77 IU/L (ref 44–121)
BUN/Creatinine Ratio: 15 (ref 9–23)
BUN: 11 mg/dL (ref 6–24)
Bilirubin Total: 0.5 mg/dL (ref 0.0–1.2)
CO2: 21 mmol/L (ref 20–29)
Calcium: 9.7 mg/dL (ref 8.7–10.2)
Chloride: 101 mmol/L (ref 96–106)
Creatinine, Ser: 0.73 mg/dL (ref 0.57–1.00)
Globulin, Total: 2.6 g/dL (ref 1.5–4.5)
Glucose: 95 mg/dL (ref 65–99)
Potassium: 4.1 mmol/L (ref 3.5–5.2)
Sodium: 138 mmol/L (ref 134–144)
Total Protein: 7.1 g/dL (ref 6.0–8.5)
eGFR: 100 mL/min/{1.73_m2} (ref >59)

## 2020-08-27 LAB — HEMOGLOBIN A1C
Est. average glucose Bld gHb Est-mCnc: 85 mg/dL
Hgb A1c MFr Bld: 4.6 % — ABNORMAL LOW (ref 4.8–5.6)

## 2020-08-27 LAB — INSULIN AND C-PEPTIDE, SERUM
C-Peptide: 10 ng/mL — ABNORMAL HIGH (ref 1.1–4.4)
INSULIN: 54.5 u[IU]/mL — ABNORMAL HIGH (ref 2.6–24.9)

## 2020-08-27 NOTE — Progress Notes (Signed)
Glucose 95, kidney and liver tests normal, A1c 4.6 normal, C-peptide 10.00 high- send with endocrine referral lp

## 2020-10-14 ENCOUNTER — Other Ambulatory Visit: Payer: Self-pay

## 2020-10-14 ENCOUNTER — Ambulatory Visit: Payer: 59 | Admitting: Endocrinology

## 2020-10-14 ENCOUNTER — Encounter: Payer: Self-pay | Admitting: Endocrinology

## 2020-10-14 VITALS — BP 120/70 | HR 85 | Ht 63.0 in | Wt 203.2 lb

## 2020-10-14 DIAGNOSIS — R739 Hyperglycemia, unspecified: Secondary | ICD-10-CM

## 2020-10-14 NOTE — Patient Instructions (Addendum)
Blood tests are requested for you today.  We'll let you know about the results.  Please do at [facility], after the longest possible fast.   If this says the same results, I should prescribe a pill for you called metformin.  We would then recheck the blood tests approx 1 month later.

## 2020-10-14 NOTE — Progress Notes (Signed)
Subjective:    Patient ID: Madison Carey, female    DOB: 01/24/1971, 50 y.o.   MRN: [id number]  HPI Pt is referred by Dr Henrene Pastor, for elev insulin levels.  Pt reports chronic difficulty losing weight.  She has never taken medication for the blood sugar.  She has never had weight loss surgery, alcohol intake, adrenal problems, pituitary problems, liver problems, or kidney problems.  She has never had pancreatic imaging.     Past Medical History:  Diagnosis Date  . Adjustment insomnia   . GERD (gastroesophageal reflux disease)   . Hypertension   . Impaired fasting glucose   . Mixed hyperlipidemia   . Other vitamin B12 deficiency anemias   . Plantar fascial fibromatosis   . Right arm fracture     Past Surgical History:  Procedure Laterality Date  . ABDOMINAL HYSTERECTOMY    . bladder tacking     . CHOLECYSTECTOMY    . crevical ablation     . CYSTOSCOPY N/A 02/16/2016   Procedure: CYSTOSCOPY;  Surgeon: Bjorn Loser, MD;  Location: [facility];  Service: Urology;  Laterality: N/A;  . REVISION URINARY SLING N/A 02/16/2016   Procedure: REMOVAL URINARY SLING;  Surgeon: Bjorn Loser, MD;  Location: [facility];  Service: Urology;  Laterality: N/A;    Social History   Socioeconomic History  . Marital status: Married    Spouse name: Not on file  . Number of children: 3  . Years of education: Not on file  . Highest education level: Not on file  Occupational History  . Occupation: Engineer, building services  Tobacco Use  . Smoking status: Former Smoker    Packs/day: 0.25    Years: 5.00    Pack years: 1.25    Types: Cigarettes    Quit date: 2020    Years since quitting: 2.3  . Smokeless tobacco: Never Used  Vaping Use  . Vaping Use: Every day  . Substances: Nicotine, Flavoring  Substance and Sexual Activity  . Alcohol use: Yes    Alcohol/week: 2.0 standard drinks    Types: 2 Glasses of wine per week    Comment: occasionally  . Drug use: No  . Sexual activity: Yes    Partners: Male   Other Topics Concern  . Not on file  Social History Narrative  . Not on file   Social Determinants of Health   Financial Resource Strain: Not on file  Food Insecurity: Not on file  Transportation Needs: Not on file  Physical Activity: Not on file  Stress: Not on file  Social Connections: Not on file  Intimate Partner Violence: Not on file    Current Outpatient Medications on File Prior to Visit  Medication Sig Dispense Refill  . dupilumab (DUPIXENT) 300 MG/2ML prefilled syringe Inject 300 mg into the skin every 14 (fourteen) days.    Marland Kitchen omeprazole (PRILOSEC) 40 MG capsule TAKE 1 CAPSULE BY MOUTH EVERY DAY IN THE MORNING 90 capsule 2  . rosuvastatin (CRESTOR) 20 MG tablet TAKE 1 TABLET BY MOUTH EVERY DAY AS DIRECTED 90 tablet 2  . zolpidem (AMBIEN) 10 MG tablet Take 1 tablet (10 mg total) by mouth at bedtime as needed. 30 tablet 3   No current facility-administered medications on file prior to visit.    Allergies  Allergen Reactions  . Codeine Itching    Family History  Problem Relation Age of Onset  . COPD Mother   . Asthma Mother   . Heart disease Father   .  Hyperlipidemia Father   . Diabetes Father     BP 120/70 (BP Location: Right Arm, Patient Position: Sitting, Cuff Size: Large)   Pulse 85   Ht 5\' 3"  (1.6 m)   Wt 203 lb 3.2 oz (92.2 kg)   SpO2 99%   BMI 36.00 kg/m   Review of Systems denies sob, n/v, and depression.     Objective:   Physical Exam VS: see vs page GEN: no distress HEAD: head: no deformity eyes: no periorbital swelling, no proptosis external nose and ears are normal NECK: supple, thyroid is not enlarged CHEST WALL: no deformity LUNGS: clear to auscultation CV: reg rate and rhythm, no murmur.  MUSCULOSKELETAL: gait is normal and steady.  EXTEMITIES: no deformity.  no leg edema NEURO:  readily moves all 4's.  sensation is intact to touch on all 4's.   SKIN:  Normal texture and temperature.  No rash or suspicious lesion is visible.    NODES:  None palpable at the neck.   PSYCH: alert, well-oriented.  Does not appear anxious nor depressed.     Lab Results  Component Value Date   HGBA1C 4.6 (L) 08/26/2020   I have reviewed outside records, and summarized: Pt was noted to have elevated insulin level, and referred here. This was noted when she was seen at "[facility]" clinic.       Assessment & Plan:  Hyperinsulinemia, new to me.  Due to metabolic syndrome.   Patient Instructions  Blood tests are requested for you today.  We'll let you know about the results.  Please do at [facility], after the longest possible fast.   If this says the same results, I should prescribe a pill for you called metformin.  We would then recheck the blood tests approx 1 month later.

## 2020-10-16 ENCOUNTER — Encounter: Payer: Self-pay | Admitting: Endocrinology

## 2020-10-16 LAB — INSULIN, RANDOM: INSULIN: 24 u[IU]/mL (ref 2.6–24.9)

## 2020-10-16 LAB — C-PEPTIDE: C-Peptide: 4 ng/mL (ref 1.1–4.4)

## 2020-10-18 ENCOUNTER — Other Ambulatory Visit: Payer: Self-pay | Admitting: Endocrinology

## 2020-10-18 DIAGNOSIS — R739 Hyperglycemia, unspecified: Secondary | ICD-10-CM

## 2020-10-18 MED ORDER — METFORMIN HCL 500 MG PO TABS: 500.0000 mg | ORAL_TABLET | Freq: Every day | ORAL | 3 refills | Status: DC

## 2020-11-16 ENCOUNTER — Other Ambulatory Visit: Payer: Self-pay | Admitting: Legal Medicine

## 2020-11-16 DIAGNOSIS — F5102 Adjustment insomnia: Secondary | ICD-10-CM

## 2020-11-25 ENCOUNTER — Encounter: Payer: Self-pay | Admitting: Endocrinology

## 2020-12-03 ENCOUNTER — Other Ambulatory Visit: Payer: Self-pay

## 2020-12-03 DIAGNOSIS — R739 Hyperglycemia, unspecified: Secondary | ICD-10-CM

## 2020-12-10 ENCOUNTER — Other Ambulatory Visit: Payer: Self-pay | Admitting: Endocrinology

## 2020-12-10 DIAGNOSIS — R739 Hyperglycemia, unspecified: Secondary | ICD-10-CM

## 2020-12-10 LAB — GLUCOSE, RANDOM: Glucose: 138 mg/dL — ABNORMAL HIGH (ref 65–99)

## 2020-12-10 LAB — INSULIN, RANDOM: INSULIN: 233 u[IU]/mL — ABNORMAL HIGH (ref 2.6–24.9)

## 2020-12-10 LAB — CORTISOL: Cortisol: 6.6 ug/dL

## 2020-12-10 MED ORDER — METFORMIN HCL 500 MG PO TABS: 500.0000 mg | ORAL_TABLET | Freq: Two times a day (BID) | ORAL | 3 refills | Status: DC

## 2020-12-30 ENCOUNTER — Encounter: Payer: Self-pay | Admitting: Gastroenterology

## 2020-12-31 ENCOUNTER — Encounter: Payer: Self-pay | Admitting: Gastroenterology

## 2021-01-13 ENCOUNTER — Ambulatory Visit: Payer: 59 | Admitting: Legal Medicine

## 2021-01-13 ENCOUNTER — Encounter: Payer: Self-pay | Admitting: Legal Medicine

## 2021-01-13 ENCOUNTER — Other Ambulatory Visit: Payer: Self-pay

## 2021-01-13 VITALS — BP 132/68 | HR 76 | Temp 95.8°F | Ht 63.0 in | Wt 215.0 lb

## 2021-01-13 DIAGNOSIS — Z1239 Encounter for other screening for malignant neoplasm of breast: Secondary | ICD-10-CM

## 2021-01-13 DIAGNOSIS — K219 Gastro-esophageal reflux disease without esophagitis: Secondary | ICD-10-CM

## 2021-01-13 DIAGNOSIS — K760 Fatty (change of) liver, not elsewhere classified: Secondary | ICD-10-CM

## 2021-01-13 DIAGNOSIS — E889 Metabolic disorder, unspecified: Secondary | ICD-10-CM

## 2021-01-13 DIAGNOSIS — E782 Mixed hyperlipidemia: Secondary | ICD-10-CM

## 2021-01-13 DIAGNOSIS — E1169 Type 2 diabetes mellitus with other specified complication: Secondary | ICD-10-CM | POA: Diagnosis not present

## 2021-01-13 LAB — POCT UA - MICROALBUMIN: Microalbumin Ur, POC: 80 mg/L

## 2021-01-13 NOTE — Progress Notes (Signed)
Established Patient Office Visit  Subjective:  Patient ID: Madison Carey, female    DOB: 1971-05-24  Age: 50 y.o. MRN: [id number]  CC:  Chief Complaint  Patient presents with   Diabetes   Hyperlipidemia    HPI Madison Carey presents for chronic visit  Patient present with type 2 diabetes.  Specifically, this is type 2, noninsulin requiring diabetes, complicated by fatty liver, .  Compliance with treatment has been good; patient take medicines as directed, maintains diet and exercise regimen, follows up as directed, and is keeping glucose diary.  Date of  diagnosis 2022.  Depression screen has been performed.Tobacco screen nonsmoker. Current medicines for diabetes metformin.  Patient is on none for renal protection and crestor for cholesterol control.  Patient performs foot exams daily and last ophthalmologic exam was none.   Patient presents with hyperlipidemia.  Compliance with treatment has been good; patient takes medicines as directed, maintains low cholesterol diet, follows up as directed, and maintains exercise regimen.  Patient is using crestor without problems.   Past Medical History:  Diagnosis Date   Adjustment insomnia    GERD (gastroesophageal reflux disease)    Hypertension    Impaired fasting glucose    Mixed hyperlipidemia    Other vitamin B12 deficiency anemias    Plantar fascial fibromatosis    Right arm fracture     Past Surgical History:  Procedure Laterality Date   ABDOMINAL HYSTERECTOMY     bladder tacking      CHOLECYSTECTOMY     crevical ablation      CYSTOSCOPY N/A 02/16/2016   Procedure: CYSTOSCOPY;  Surgeon: Bjorn Loser, MD;  Location: [facility];  Service: Urology;  Laterality: N/A;   REVISION URINARY SLING N/A 02/16/2016   Procedure: REMOVAL URINARY SLING;  Surgeon: Bjorn Loser, MD;  Location: [facility];  Service: Urology;  Laterality: N/A;    Family History  Problem Relation Age of Onset   COPD Mother    Asthma Mother    Heart  disease Father    Hyperlipidemia Father    Diabetes Father     Social History   Socioeconomic History   Marital status: Married    Spouse name: Not on file   Number of children: 3   Years of education: Not on file   Highest education level: Not on file  Occupational History   Occupation: Engineer, building services  Tobacco Use   Smoking status: Former    Packs/day: 0.25    Years: 5.00    Pack years: 1.25    Types: Cigarettes    Quit date: 2020    Years since quitting: 2.6   Smokeless tobacco: Never  Vaping Use   Vaping Use: Every day   Substances: Nicotine, Flavoring  Substance and Sexual Activity   Alcohol use: Yes    Alcohol/week: 2.0 standard drinks    Types: 2 Glasses of wine per week    Comment: occasionally   Drug use: No   Sexual activity: Yes    Partners: Male  Other Topics Concern   Not on file  Social History Narrative   Not on file   Social Determinants of Health   Financial Resource Strain: Not on file  Food Insecurity: Not on file  Transportation Needs: Not on file  Physical Activity: Not on file  Stress: Not on file  Social Connections: Not on file  Intimate Partner Violence: Not on file    Outpatient Medications Prior to Visit  Medication Sig Dispense Refill  dupilumab (DUPIXENT) 300 MG/2ML prefilled syringe Inject 300 mg into the skin every 14 (fourteen) days.     metFORMIN (GLUCOPHAGE) 500 MG tablet Take 1 tablet (500 mg total) by mouth 2 (two) times daily with a meal. 180 tablet 3   omeprazole (PRILOSEC) 40 MG capsule TAKE 1 CAPSULE BY MOUTH EVERY DAY IN THE MORNING 90 capsule 2   rosuvastatin (CRESTOR) 20 MG tablet TAKE 1 TABLET BY MOUTH EVERY DAY AS DIRECTED 90 tablet 2   zolpidem (AMBIEN) 10 MG tablet TAKE ONE TABLET BY MOUTH AT BEDTIME AS NEEDED 30 tablet 3   No facility-administered medications prior to visit.    Allergies  Allergen Reactions   Codeine Itching    ROS Review of Systems  Constitutional:  Negative for activity change and  appetite change.  HENT:  Negative for congestion.   Eyes:  Negative for visual disturbance.  Respiratory:  Negative for chest tightness and shortness of breath.   Cardiovascular:  Negative for chest pain and leg swelling.  Gastrointestinal:  Negative for abdominal distention and abdominal pain.  Endocrine: Negative for polyuria.  Genitourinary:  Negative for difficulty urinating and dysuria.  Musculoskeletal:  Negative for arthralgias and back pain.  Neurological: Negative.   Psychiatric/Behavioral: Negative.       Objective:    Physical Exam Vitals reviewed.  Constitutional:      Appearance: Normal appearance.  HENT:     Head: Normocephalic.     Right Ear: Tympanic membrane, ear canal and external ear normal.     Left Ear: Tympanic membrane, ear canal and external ear normal.     Mouth/Throat:     Mouth: Mucous membranes are moist.     Pharynx: Oropharynx is clear.  Eyes:     Extraocular Movements: Extraocular movements intact.     Conjunctiva/sclera: Conjunctivae normal.     Pupils: Pupils are equal, round, and reactive to light.  Cardiovascular:     Rate and Rhythm: Normal rate and regular rhythm.     Pulses: Normal pulses.     Heart sounds: Normal heart sounds. No murmur heard.   No gallop.  Pulmonary:     Effort: Pulmonary effort is normal. No respiratory distress.     Breath sounds: Normal breath sounds. No wheezing.  Abdominal:     General: Abdomen is flat. Bowel sounds are normal. There is no distension.     Palpations: Abdomen is soft.     Tenderness: There is no abdominal tenderness.  Musculoskeletal:        General: Normal range of motion.     Cervical back: Normal range of motion and neck supple.  Skin:    General: Skin is warm and dry.     Capillary Refill: Capillary refill takes less than 2 seconds.  Neurological:     General: No focal deficit present.     Mental Status: She is alert and oriented to person, place, and time. Mental status is at baseline.   Psychiatric:        Mood and Affect: Mood normal.        Thought Content: Thought content normal.        Judgment: Judgment normal.    BP 132/68   Pulse 76   Temp (!) 95.8 F (35.4 C)   Ht 5' 3"  (1.6 m)   Wt 215 lb (97.5 kg)   BMI 38.09 kg/m  Wt Readings from Last 3 Encounters:  01/13/21 215 lb (97.5 kg)  10/14/20 203 lb 3.2 oz (  92.2 kg)  08/26/20 207 lb (93.9 kg)     Health Maintenance Due  Topic Date Due   PNEUMOCOCCAL POLYSACCHARIDE VACCINE AGE 60-64 HIGH RISK  Never done   OPHTHALMOLOGY EXAM  Never done   HIV Screening  Never done   Hepatitis C Screening  Never done   TETANUS/TDAP  Never done   PAP SMEAR-Modifier  Never done   COLONOSCOPY (Pts 45-9yr Insurance coverage will need to be confirmed)  Never done   Zoster Vaccines- Shingrix (1 of 2) Never done   INFLUENZA VACCINE  01/04/2021    There are no preventive care reminders to display for this patient.  Lab Results  Component Value Date   TSH 0.856 08/30/2019   Lab Results  Component Value Date   WBC 7.1 07/16/2020   HGB 12.8 07/16/2020   HCT 37.3 07/16/2020   MCV 90 07/16/2020   PLT 272 07/16/2020   Lab Results  Component Value Date   NA 138 08/26/2020   K 4.1 08/26/2020   CO2 21 08/26/2020   GLUCOSE 138 (H) 12/09/2020   BUN 11 08/26/2020   CREATININE 0.73 08/26/2020   BILITOT 0.5 08/26/2020   ALKPHOS 77 08/26/2020   AST 26 08/26/2020   ALT 30 08/26/2020   PROT 7.1 08/26/2020   ALBUMIN 4.5 08/26/2020   CALCIUM 9.7 08/26/2020   ANIONGAP 9 02/17/2016   EGFR 100 08/26/2020   Lab Results  Component Value Date   CHOL 173 07/16/2020   Lab Results  Component Value Date   HDL 42 07/16/2020   Lab Results  Component Value Date   LDLCALC 79 07/16/2020   Lab Results  Component Value Date   TRIG 324 (H) 07/16/2020   Lab Results  Component Value Date   CHOLHDL 4.1 07/16/2020   Lab Results  Component Value Date   HGBA1C 4.6 (L) 08/26/2020      Assessment & Plan:   Problem List  Items Addressed This Visit       Digestive   GERD (gastroesophageal reflux disease) Plan of care was formulated today.  She is doing well.  A plan of care was formulated using patient exam, tests and other sources to optimize care using evidence based information.  Recommend no smoking, no eating after supper, avoid fatty foods, elevate Head of bed, avoid tight fitting clothing.  Continue on omeprazole for reflex .    Fatty liver Patient has fatty liver and we are watching     Endocrine   Abnormal metabolic state in diabetes mellitus ([place] - Primary   Relevant Orders   CBC with Differential/Platelet   Comprehensive metabolic panel   Hemoglobin A1c   POCT UA - Microalbumin (Completed) An individual care plan for diabetes was established and reinforced today.  The patient's status was assessed using clinical findings on exam, labs and diagnostic testing. Patient success at meeting goals based on disease specific evidence-based guidelines and found to be fair controlled. Medications were assessed and patient's understanding of the medical issues , including barriers were assessed. Recommend adherence to a diabetic diet, a graduated exercise program, HgbA1c level is checked quarterly, and urine microalbumin performed yearly .  Annual mono-filament sensation testing performed. Lower blood pressure and control hyperlipidemia is important. Get annual eye exams and annual flu shots and smoking cessation discussed.  Self management goals were discussed.      Other   Mixed hyperlipidemia   Relevant Orders   CBC with Differential/Platelet   Comprehensive metabolic panel   Lipid  panel AN INDIVIDUAL CARE PLAN for hyperlipidemia/ cholesterol was established and reinforced today.  The patient's status was assessed using clinical findings on exam, lab and other diagnostic tests. The patient's disease status was assessed based on evidence-based guidelines and found to be fair controlled. MEDICATIONS were  reviewed. SELF MANAGEMENT GOALS have been discussed and patient's success at attaining the goal of low cholesterol was assessed. RECOMMENDATION given include regular exercise 3 days a week and low cholesterol/low fat diet. CLINICAL SUMMARY including written plan to identify barriers unique to the patient due to social or economic  reasons was discussed.    Other Visit Diagnoses     Encounter for breast cancer screening using non-mammogram modality       Relevant Orders   MM DIGITAL SCREENING BILATERAL         Follow-up: Return in about 4 months (around 05/15/2021) for fasting.    Reinaldo Meeker, MD

## 2021-01-14 ENCOUNTER — Telehealth: Payer: Self-pay | Admitting: Legal Medicine

## 2021-01-14 LAB — CBC WITH DIFFERENTIAL/PLATELET
Basophils Absolute: 0 10*3/uL (ref 0.0–0.2)
Basos: 1 %
EOS (ABSOLUTE): 0.1 10*3/uL (ref 0.0–0.4)
Eos: 1 %
Hematocrit: 39.2 % (ref 34.0–46.6)
Hemoglobin: 13 g/dL (ref 11.1–15.9)
Immature Grans (Abs): 0.1 10*3/uL (ref 0.0–0.1)
Immature Granulocytes: 1 %
Lymphocytes Absolute: 1.8 10*3/uL (ref 0.7–3.1)
Lymphs: 22 %
MCH: 31 pg (ref 26.6–33.0)
MCHC: 33.2 g/dL (ref 31.5–35.7)
MCV: 93 fL (ref 79–97)
Monocytes Absolute: 0.6 10*3/uL (ref 0.1–0.9)
Monocytes: 8 %
Neutrophils Absolute: 5.4 10*3/uL (ref 1.4–7.0)
Neutrophils: 67 %
Platelets: 279 10*3/uL (ref 150–450)
RBC: 4.2 x10E6/uL (ref 3.77–5.28)
RDW: 14.3 % (ref 11.7–15.4)
WBC: 8 10*3/uL (ref 3.4–10.8)

## 2021-01-14 LAB — COMPREHENSIVE METABOLIC PANEL
ALT: 29 IU/L (ref 0–32)
AST: 22 IU/L (ref 0–40)
Albumin/Globulin Ratio: 2.4 — ABNORMAL HIGH (ref 1.2–2.2)
Albumin: 4.8 g/dL (ref 3.8–4.9)
Alkaline Phosphatase: 77 IU/L (ref 44–121)
BUN/Creatinine Ratio: 16 (ref 9–23)
BUN: 11 mg/dL (ref 6–24)
Bilirubin Total: 0.5 mg/dL (ref 0.0–1.2)
CO2: 19 mmol/L — ABNORMAL LOW (ref 20–29)
Calcium: 9.6 mg/dL (ref 8.7–10.2)
Chloride: 105 mmol/L (ref 96–106)
Creatinine, Ser: 0.7 mg/dL (ref 0.57–1.00)
Globulin, Total: 2 g/dL (ref 1.5–4.5)
Glucose: 96 mg/dL (ref 65–99)
Potassium: 4.4 mmol/L (ref 3.5–5.2)
Sodium: 141 mmol/L (ref 134–144)
Total Protein: 6.8 g/dL (ref 6.0–8.5)
eGFR: 105 mL/min/{1.73_m2} (ref >59)

## 2021-01-14 LAB — CARDIOVASCULAR RISK ASSESSMENT

## 2021-01-14 LAB — LIPID PANEL
Chol/HDL Ratio: 3.7 ratio (ref 0.0–4.4)
Cholesterol, Total: 151 mg/dL (ref 100–199)
HDL: 41 mg/dL (ref >39)
LDL Chol Calc (NIH): 40 mg/dL (ref 0–99)
Triglycerides: 491 mg/dL — ABNORMAL HIGH (ref 0–149)
VLDL Cholesterol Cal: 70 mg/dL — ABNORMAL HIGH (ref 5–40)

## 2021-01-14 LAB — HEMOGLOBIN A1C
Est. average glucose Bld gHb Est-mCnc: 88 mg/dL
Hgb A1c MFr Bld: 4.7 % — ABNORMAL LOW (ref 4.8–5.6)

## 2021-01-14 NOTE — Telephone Encounter (Signed)
   Madison Carey has been scheduled for the following appointment:  WHAT: SCREENING MAMMOGRAM WHERE: [facility] DATE: 01/28/21 TIME: 12:30 PM ARRIVAL  A message has been left for the patient.

## 2021-01-14 NOTE — Progress Notes (Signed)
CBC normal, kidney and liver tests all normal, A1c 4.7 good, Cholesterol triglycerides High 491- watch diet lp

## 2021-02-03 ENCOUNTER — Encounter: Payer: Self-pay | Admitting: Gastroenterology

## 2021-02-03 ENCOUNTER — Other Ambulatory Visit: Payer: Self-pay

## 2021-02-03 ENCOUNTER — Ambulatory Visit (INDEPENDENT_AMBULATORY_CARE_PROVIDER_SITE_OTHER): Payer: 59 | Admitting: Gastroenterology

## 2021-02-03 VITALS — BP 128/80 | HR 82 | Ht 63.0 in | Wt 205.0 lb

## 2021-02-03 DIAGNOSIS — R1013 Epigastric pain: Secondary | ICD-10-CM

## 2021-02-03 DIAGNOSIS — K219 Gastro-esophageal reflux disease without esophagitis: Secondary | ICD-10-CM

## 2021-02-03 DIAGNOSIS — K580 Irritable bowel syndrome with diarrhea: Secondary | ICD-10-CM

## 2021-02-03 DIAGNOSIS — Z1211 Encounter for screening for malignant neoplasm of colon: Secondary | ICD-10-CM | POA: Diagnosis not present

## 2021-02-03 NOTE — Progress Notes (Signed)
Chief Complaint: For GI evaluation  Referring Provider:  Lillard Anes,*      ASSESSMENT AND PLAN;   #1. GERD with HH  #2. Epi pain. S/P lap chole  #3.  Colorectal cancer screening  #3. IBS-D. With assoc post-cholecystectomy diarrhea and d/t metformin.  Plan: -EGD with SB bx/colon -Continue omeprazole -Can take imodium PRN -If continued pain, consider solid-phase GES followed by CT AP, if needed.   I discussed EGD/Colonoscopy- the indications, risks, alternatives and potential complications including, but not limited to, bleeding, infection, reaction to medication, damage to internal organs, cardiac and/or pulmonary problems, and perforation requiring surgery (1 to 2 in 1000). The possibility that significant findings could be missed was explained. All ? were answered. The patient gives consent to proceed. HPI:    Madison Carey is a 50 y.o. female  Hyperinsulinemia d/t metabolic syndrome (potential diabetic), HLD, insomnia.  C/O longstanding GERD (s/p EGD Dr Jerilynn Mages- 14 yrs ago, small HH) without odynophagia or dysphagia.  Has been on omeprazole 20 mg p.o. once daily for last several years.  She weaned herself off as she was concerned regarding side effects.  Currently using metoprolol on as-needed basis.  C/O Epi pain x yrs, worse after eating.  Nonradiating.  Feels like food sticks in the stomach for longer time.  Denies any nausea or vomiting.  Longstanding diarrhea 5-6/day, after eating, worse since metformin. No noct s/s. No wt loss. No blood in the stool.  Does have lactose intolerance as problems drinking milk.  No problems with foods containing gluten.  Been advised to get EGD and colonoscopy performed.  No sodas, chocolates, chewing gums, artificial sweeteners and candy. No NSAIDs  No weight loss.  No jaundice dark urine or pale stools.  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 Past Medical History:  Diagnosis Date   Adjustment insomnia    GERD  (gastroesophageal reflux disease)    Hypertension    Impaired fasting glucose    Mixed hyperlipidemia    Other vitamin B12 deficiency anemias    Plantar fascial fibromatosis    Right arm fracture     Past Surgical History:  Procedure Laterality Date   ABDOMINAL HYSTERECTOMY     bladder tacking      CHOLECYSTECTOMY     crevical ablation      CYSTOSCOPY N/A 02/16/2016   Procedure: CYSTOSCOPY;  Surgeon: Bjorn Loser, MD;  Location: [facility];  Service: Urology;  Laterality: N/A;   REVISION URINARY SLING N/A 02/16/2016   Procedure: REMOVAL URINARY SLING;  Surgeon: Bjorn Loser, MD;  Location: [facility];  Service: Urology;  Laterality: N/A;    Family History  Problem Relation Age of Onset   COPD Mother    Asthma Mother    Heart disease Father    Hyperlipidemia Father    Diabetes Father    Colon cancer Neg Hx    Pancreatic cancer Neg Hx    Stomach cancer Neg Hx    Liver disease Neg Hx     Social History   Tobacco Use   Smoking status: Former    Packs/day: 0.25    Years: 5.00    Pack years: 1.25    Types: Cigarettes    Quit date: 2020    Years since quitting: 2.6   Smokeless tobacco: Never  Vaping Use   Vaping Use: Every day   Substances: Nicotine, Flavoring  Substance Use Topics   Alcohol use: Yes    Alcohol/week: 2.0 standard drinks  Types: 2 Glasses of wine per week    Comment: occasionally   Drug use: No    Current Outpatient Medications  Medication Sig Dispense Refill   metFORMIN (GLUCOPHAGE) 500 MG tablet Take 1 tablet (500 mg total) by mouth 2 (two) times daily with a meal. 180 tablet 3   omeprazole (PRILOSEC) 40 MG capsule TAKE 1 CAPSULE BY MOUTH EVERY DAY IN THE MORNING 90 capsule 2   rosuvastatin (CRESTOR) 20 MG tablet TAKE 1 TABLET BY MOUTH EVERY DAY AS DIRECTED 90 tablet 2   zolpidem (AMBIEN) 10 MG tablet TAKE ONE TABLET BY MOUTH AT BEDTIME AS NEEDED 30 tablet 3   No current facility-administered medications for this visit.    Allergies   Allergen Reactions   Codeine Itching    Review of Systems:  Constitutional: Denies fever, chills, diaphoresis, appetite change and fatigue.  HEENT: Denies photophobia, eye pain, redness, hearing loss, ear pain, congestion, sore throat, rhinorrhea, sneezing, mouth sores, neck pain, neck stiffness and tinnitus.   Respiratory: Denies SOB, DOE, cough, chest tightness,  and wheezing.   Cardiovascular: Denies chest pain, palpitations and leg swelling.  Genitourinary: Denies dysuria, urgency, frequency, hematuria, flank pain and difficulty urinating.  Musculoskeletal: Denies myalgias, back pain, joint swelling, arthralgias and gait problem.  Skin: No rash.  Neurological: Denies dizziness, seizures, syncope, weakness, light-headedness, numbness and headaches.  Hematological: Denies adenopathy. Easy bruising, personal or family bleeding history  Psychiatric/Behavioral: has insomnia.  No anxiety or depression     Physical Exam:    BP 128/80   Pulse 82   Ht '5\' 3"'$  (1.6 m)   Wt 205 lb (93 kg)   SpO2 98%   BMI 36.31 kg/m  Wt Readings from Last 3 Encounters:  02/03/21 205 lb (93 kg)  01/13/21 215 lb (97.5 kg)  10/14/20 203 lb 3.2 oz (92.2 kg)   Constitutional:  Well-developed, in no acute distress. Psychiatric: Normal mood and affect. Behavior is normal. HEENT: Pupils normal.  Conjunctivae are normal. No scleral icterus. Neck supple.  Cardiovascular: Normal rate, regular rhythm. No edema Pulmonary/chest: Effort normal and breath sounds normal. No wheezing, rales or rhonchi. Abdominal: Soft, nondistended. Nontender. Bowel sounds active throughout. There are no masses palpable. No hepatomegaly. Rectal: Deferred Neurological: Alert and oriented to person place and time. Skin: Skin is warm and dry. No rashes noted.  Data Reviewed: I have personally reviewed following labs and imaging studies  CBC: CBC Latest Ref Rng & Units 01/13/2021 07/16/2020 01/14/2020  WBC 3.4 - 10.8 x10E3/uL 8.0 7.1  6.0  Hemoglobin 11.1 - 15.9 g/dL 13.0 12.8 12.0  Hematocrit 34.0 - 46.6 % 39.2 37.3 36.6  Platelets 150 - 450 x10E3/uL 279 272 232    CMP: CMP Latest Ref Rng & Units 01/13/2021 12/09/2020 08/26/2020  Glucose 65 - 99 mg/dL 96 138(H) 95  BUN 6 - 24 mg/dL 11 - 11  Creatinine 0.57 - 1.00 mg/dL 0.70 - 0.73  Sodium 134 - 144 mmol/L 141 - 138  Potassium 3.5 - 5.2 mmol/L 4.4 - 4.1  Chloride 96 - 106 mmol/L 105 - 101  CO2 20 - 29 mmol/L 19(L) - 21  Calcium 8.7 - 10.2 mg/dL 9.6 - 9.7  Total Protein 6.0 - 8.5 g/dL 6.8 - 7.1  Total Bilirubin 0.0 - 1.2 mg/dL 0.5 - 0.5  Alkaline Phos 44 - 121 IU/L 77 - 77  AST 0 - 40 IU/L 22 - 26  ALT 0 - 32 IU/L 29 - 30  Carmell Austria, MD 02/03/2021, 10:45 AM  Cc: Lillard Anes

## 2021-02-03 NOTE — Patient Instructions (Signed)
Continue Omeprazole.   You may take Imodium as needed for diarrhea.   You have been scheduled for an endoscopy and colonoscopy. Please follow the written instructions given to you at your visit today. Please pick up your prep supplies at the pharmacy within the next 1-3 days. If you use inhalers (even only as needed), please bring them with you on the day of your procedure. (Sample of Clenpiq given today)   If you are age 51 or younger, your body mass index should be between 19-25. Your Body mass index is 36.31 kg/m. If this is out of the aformentioned range listed, please consider follow up with your Primary Care Provider.   __________________________________________________________  The [facility] providers would like to encourage you to use [facility] to communicate with providers for non-urgent requests or questions.  Due to long hold times on the telephone, sending your provider a message by [facility] may be a faster and more efficient way to get a response.  Please allow 48 business hours for a response.  Please remember that this is for non-urgent requests.   Thank you,  Dr. Jackquline Denmark

## 2021-02-10 ENCOUNTER — Other Ambulatory Visit: Payer: Self-pay | Admitting: Legal Medicine

## 2021-02-10 DIAGNOSIS — E782 Mixed hyperlipidemia: Secondary | ICD-10-CM

## 2021-02-10 DIAGNOSIS — F5102 Adjustment insomnia: Secondary | ICD-10-CM

## 2021-03-25 ENCOUNTER — Encounter: Payer: Self-pay | Admitting: Gastroenterology

## 2021-04-06 ENCOUNTER — Encounter: Payer: 59 | Admitting: Gastroenterology

## 2021-04-13 ENCOUNTER — Other Ambulatory Visit: Payer: Self-pay | Admitting: Legal Medicine

## 2021-04-13 DIAGNOSIS — K219 Gastro-esophageal reflux disease without esophagitis: Secondary | ICD-10-CM

## 2021-04-16 ENCOUNTER — Ambulatory Visit: Payer: 59 | Admitting: Nurse Practitioner

## 2021-04-16 ENCOUNTER — Other Ambulatory Visit: Payer: Self-pay

## 2021-04-16 ENCOUNTER — Encounter: Payer: Self-pay | Admitting: Nurse Practitioner

## 2021-04-16 VITALS — BP 122/74 | HR 82 | Temp 97.6°F | Resp 18 | Ht 63.0 in | Wt 209.6 lb

## 2021-04-16 DIAGNOSIS — M542 Cervicalgia: Secondary | ICD-10-CM

## 2021-04-16 MED ORDER — IBUPROFEN 800 MG PO TABS: 800.0000 mg | ORAL_TABLET | Freq: Three times a day (TID) | ORAL | 0 refills | Status: DC | PRN

## 2021-04-16 MED ORDER — CYCLOBENZAPRINE HCL 10 MG PO TABS: 10.0000 mg | ORAL_TABLET | Freq: Three times a day (TID) | ORAL | 0 refills | Status: DC | PRN

## 2021-04-16 MED ORDER — PREDNISONE 20 MG PO TABS: 20.0000 mg | ORAL_TABLET | Freq: Every day | ORAL | 0 refills | Status: DC

## 2021-04-16 NOTE — Patient Instructions (Addendum)
Take Ibuprofen as needed for pain as directed Take Flexeril 10 mg up to 3 times daily Take Prednisone taper as prescribed Perform neck exercises daily Obtain neck x-ray at [place] to alternate heat and ice to right neck area Follow-up in 2 weeks     Cervical Sprain A cervical sprain is also called a neck sprain. It is a stretch or tear in one or more ligaments in the neck. Ligaments are tissues that connect bones to each other. Neck sprains can be mild, bad, or very bad. A very bad sprain in the neck can cause the bones in the neck to be unstable. This can damage the spinal cord. It can also cause serious problems in the brain, spinal cord, and nerves (nervous system). Most neck sprains heal in 4-6 weeks. It can take more or less time depending on: What caused the injury. The amount of injury. What are the causes? Neck sprains may be caused by trauma, such as: An injury from an accident in a vehicle such as a car or boat. A fall. The head and neck being moved front to back or side to side all of a sudden (whiplash injury). Mild neck sprains may be caused by wear and tear over time. What increases the risk? The following factors may make you more likely to develop this condition: Taking part in activities that put you at high risk of hurting your neck. These include: Contact sports. Animator. Gymnastics. Diving. Taking risks when driving or riding in a vehicle such as a car or boat. Arthritis caused by wear and tear of the joints in the spine. The neck not being very strong or flexible. Having had a neck injury in the past. Poor posture. Spending a lot of time in certain positions that put stress on the neck. This may be from sitting at a computer for a long time. What are the signs or symptoms? Symptoms of this condition include: Your neck, shoulders, or upper back feeling: Painful or sore. Stiff. Tender. Swollen. Hot, or like it is burning. Sudden  tightening of neck muscles (spasms). Not being able to move the neck very much. Headache. Feeling dizzy. Feeling like you may vomit, or vomiting. Having a hand or arm that: Feels weak. Loses feeling (feels numb). Tingles. You may get symptoms right away after injury, or you may get them over a few days. In some cases, symptoms may go away with treatment and come back over time. How is this treated? This condition is treated by: Resting your neck. Icing the part of your neck that is hurt. Doing exercises to restore movement and strength to your neck (physical therapy). If there is no swelling, you may use heat therapy 2-3 days after the injury took place. If your injury is very bad, treatment may also include: Keeping your neck in place for a length of time. This may be done using: A neck collar. This supports your chin and the back of your head. A cervical traction device. This is a sling that holds up your head. The sling removes weight and pressure from your neck. It may also help to relieve pain. Medicines that help with: Pain. Irritation and swelling (inflammation). Medicines that help to relax your muscles (muscle relaxants). Surgery. This is rare. Follow these instructions at home: Medicines  Take over-the-counter and prescription medicines only as told by your doctor. Ask your doctor if the medicine prescribed to you: Requires you to avoid driving or using heavy machinery.  Can cause trouble pooping (constipation). You may need to take these actions to prevent or treat trouble pooping: Drink enough fluid to keep your pee (urine) pale yellow. Take over-the-counter or prescription medicines. Eat foods that are high in fiber. These include beans, whole grains, and fresh fruits and vegetables. Limit foods that are high in fat and sugar. These include fried or sweet foods. If you have a neck collar: Wear it as told by your doctor. Do not take it off unless told. Ask your doctor  before adjusting your collar. If you have long hair, keep it outside of the collar. Ask your doctor if you may take off the collar for cleaning and bathing. If you may take off the collar: Follow instructions about how to take it off safely. Clean it by hand with mild soap and water. Let it air-dry fully. If your collar has pads that you can take out: Take the pads out every 1-2 days. Wash them by hand with soap and water. Let the pads air-dry fully before you put them back in the collar. Tell your doctor if your skin under the collar has irritation or sores. Managing pain, stiffness, and swelling   Use a cervical traction device, if told by your doctor. If told, put ice on the affected area. To do this: Put ice in a plastic bag. Place a towel between your skin and the bag. Leave the ice on for 20 minutes, 2-3 times a day. If told, put heat on the affected area. Do this before exercise or as often as told by your doctor. Use the heat source that your doctor recommends, such as a moist heat pack or a heating pad. Place a towel between your skin and the heat source. Leave the heat on for 20-30 minutes. Take the heat off if your skin turns bright red. This is very important if you cannot feel pain, heat, or cold. You may have a greater risk of getting burned. Activity Do not drive while wearing a neck collar. If you do not have a neck collar, ask if it is safe to drive while your neck heals. Do not lift anything that is heavier than 10 lb (4.5 kg), or the limit that you are told, until your doctor tells you that it is safe. Rest as told by your doctor. Do exercises as told by your doctor or physical therapist. Return to your normal activities as told by your doctor. Avoid positions and activities that make you feel worse. Ask your doctor what activities are safe for you. General instructions Do not use any products that contain nicotine or tobacco, such as cigarettes, e-cigarettes, and  chewing tobacco. These can delay healing. If you need help quitting, ask your doctor. Keep all follow-up visits as told by your doctor or physical therapist. This is important. How is this prevented? To prevent a neck sprain from happening again: Practice good posture. Adjust your workstation to help you do this. Exercise regularly as told by your doctor or physical therapist. Avoid activities that are risky or may cause a neck sprain. Contact a doctor if: Your symptoms get worse. Your symptoms do not get better after 2 weeks of treatment. Your pain gets worse. Medicine does not help your pain. You have new symptoms that you cannot explain. Your neck collar gives you sores on your skin or bothers your skin. Get help right away if: You have very bad pain. You get any of the following in any part of your  body: Loss of feeling. Tingling. Weakness. You cannot move a part of your body. You have neck pain and either of these: Very bad dizziness. A very bad headache. Summary A cervical sprain is also called a neck sprain. It is a stretch or tear in one or more ligaments in the neck. Ligaments are tissues that connect bones. Neck sprains may be caused by trauma, such as an injury or a fall. You may get symptoms right away after injury, or you may get them over a few days. Neck sprains may be treated with rest, heat, ice, medicines, exercise, and surgery. This information is not intended to replace advice given to you by your health care provider. Make sure you discuss any questions you have with your health care provider. Document Revised: 01/30/2019 Document Reviewed: 01/30/2019 Elsevier Patient Education  [place].    Neck Exercises Ask your health care provider which exercises are safe for you. Do exercises exactly as told by your health care provider and adjust them as directed. It is normal to feel mild stretching, pulling, tightness, or discomfort as you do these  exercises. Stop right away if you feel sudden pain or your pain gets worse. Do not begin these exercises until told by your health care provider. Neck exercises can be important for many reasons. They can improve strength and maintain flexibility in your neck, which will help your upper back and prevent neck pain. Stretching exercises Rotation neck stretching  Sit in a chair or stand up. Place your feet flat on the floor, shoulder-width apart. Slowly turn your head (rotate) to the right until a slight stretch is felt. Turn it all the way to the right so you can look over your right shoulder. Do not tilt or tip your head. Hold this position for 10-30 seconds. Slowly turn your head (rotate) to the left until a slight stretch is felt. Turn it all the way to the left so you can look over your left shoulder. Do not tilt or tip your head. Hold this position for 10-30 seconds. Repeat __________ times. Complete this exercise __________ times a day. Neck retraction  Sit in a sturdy chair or stand up. Look straight ahead. Do not bend your neck. Use your fingers to push your chin backward (retraction). Do not bend your neck for this movement. Continue to face straight ahead. If you are doing the exercise properly, you will feel a slight sensation in your throat and a stretch at the back of your neck. Hold the stretch for 1-2 seconds. Repeat __________ times. Complete this exercise __________ times a day. Strengthening exercises Neck press  Lie on your back on a firm bed or on the floor with a pillow under your head. Use your neck muscles to push your head down on the pillow and straighten your spine. Hold the position as well as you can. Keep your head facing up (in a neutral position) and your chin tucked. Slowly count to 5 while holding this position. Repeat __________ times. Complete this exercise __________ times a day. Isometrics These are exercises in which you strengthen the muscles in your  neck while keeping your neck still (isometrics). Sit in a supportive chair and place your hand on your forehead. Keep your head and face facing straight ahead. Do not flex or extend your neck while doing isometrics. Push forward with your head and neck while pushing back with your hand. Hold for 10 seconds. Do the sequence again, this time putting your hand  against the back of your head. Use your head and neck to push backward against the hand pressure. Finally, do the same exercise on either side of your head, pushing sideways against the pressure of your hand. Repeat __________ times. Complete this exercise __________ times a day. Prone head lifts  Lie face-down (prone position), resting on your elbows so that your chest and upper back are raised. Start with your head facing downward, near your chest. Position your chin either on or near your chest. Slowly lift your head upward. Lift until you are looking straight ahead. Then continue lifting your head as far back as you can comfortably stretch. Hold your head up for 5 seconds. Then slowly lower it to your starting position. Repeat __________ times. Complete this exercise __________ times a day. Supine head lifts  Lie on your back (supine position), bending your knees to point to the ceiling and keeping your feet flat on the floor. Lift your head slowly off the floor, raising your chin toward your chest. Hold for 5 seconds. Repeat __________ times. Complete this exercise __________ times a day. Scapular retraction  Stand with your arms at your sides. Look straight ahead. Slowly pull both shoulders (scapulae) backward and downward (retraction) until you feel a stretch between your shoulder blades in your upper back. Hold for 10-30 seconds. Relax and repeat. Repeat __________ times. Complete this exercise __________ times a day. Contact a health care provider if: Your neck pain or discomfort gets worse when you do an exercise. Your neck  pain or discomfort does not improve within 2 hours after you exercise. If you have any of these problems, stop exercising right away. Do not do the exercises again unless your health care provider says that you can. Get help right away if: You develop sudden, severe neck pain. If this happens, stop exercising right away. Do not do the exercises again unless your health care provider says that you can. This information is not intended to replace advice given to you by your health care provider. Make sure you discuss any questions you have with your health care provider. Document Revised: 11/17/2020 Document Reviewed: 11/17/2020 Elsevier Patient Education  [place].

## 2021-04-16 NOTE — Progress Notes (Signed)
Acute Office Visit  Subjective:    Patient ID: Madison Carey, female    DOB: December 15, 1970, 50 y.o.   MRN: [id number]  Chief Complaint  Patient presents with   Neck Pain     HPI: Patient is in today for neck pain. Pain located on Rt side of neck. She denies previous neck pain or injury. Neck Pain  She reports new onset neck pain. There was not an injury that may have caused the pain. The most recent episode started a few months ago and is rapidly worsening. The pain is located in the right posterior neckwith radiation to right shoulder It is described as aching, burning, pinching, sharp, soreness, stabbing, stiffness, throbbing, and tingling, is 9/10 in intensity, occurring constantly. Symptoms are worse in the: consistent throughout the day and night  Aggravating factors: turning right Relieving factors: none.  She has tried application of heat, application of ice, acetaminophen, NSAIDs, topical anesthetics, chiropractic treatments, and message therapy with little relief.   Associated symptoms: No chest pain No fever  Yes headaches No joint pains  Yes numbness in hands/fingers No sore throat  No swallowing problems Yes tingling in hands and fingers  No weakness       Past Medical History:  Diagnosis Date   Adjustment insomnia    GERD (gastroesophageal reflux disease)    Hypertension    Impaired fasting glucose    Mixed hyperlipidemia    Other vitamin B12 deficiency anemias    Plantar fascial fibromatosis    Right arm fracture     Past Surgical History:  Procedure Laterality Date   ABDOMINAL HYSTERECTOMY     bladder tacking      CHOLECYSTECTOMY     crevical ablation      CYSTOSCOPY N/A 02/16/2016   Procedure: CYSTOSCOPY;  Surgeon: Bjorn Loser, MD;  Location: [facility];  Service: Urology;  Laterality: N/A;   REVISION URINARY SLING N/A 02/16/2016   Procedure: REMOVAL URINARY SLING;  Surgeon: Bjorn Loser, MD;  Location: [facility];  Service: Urology;  Laterality:  N/A;    Family History  Problem Relation Age of Onset   COPD Mother    Asthma Mother    Heart disease Father    Hyperlipidemia Father    Diabetes Father    Colon cancer Neg Hx    Pancreatic cancer Neg Hx    Stomach cancer Neg Hx    Liver disease Neg Hx     Social History   Socioeconomic History   Marital status: Married    Spouse name: Not on file   Number of children: 3   Years of education: Not on file   Highest education level: Not on file  Occupational History   Occupation: Engineer, building services  Tobacco Use   Smoking status: Former    Packs/day: 0.25    Years: 5.00    Pack years: 1.25    Types: Cigarettes    Quit date: 2020    Years since quitting: 2.8   Smokeless tobacco: Never  Vaping Use   Vaping Use: Every day   Substances: Nicotine, Flavoring  Substance and Sexual Activity   Alcohol use: Yes    Alcohol/week: 2.0 standard drinks    Types: 2 Glasses of wine per week    Comment: occasionally   Drug use: No   Sexual activity: Yes    Partners: Male  Other Topics Concern   Not on file  Social History Narrative   Not on file   Social Determinants  of Health   Financial Resource Strain: Not on file  Food Insecurity: Not on file  Transportation Needs: Not on file  Physical Activity: Not on file  Stress: Not on file  Social Connections: Not on file  Intimate Partner Violence: Not on file    Outpatient Medications Prior to Visit  Medication Sig Dispense Refill   metFORMIN (GLUCOPHAGE) 500 MG tablet Take 1 tablet (500 mg total) by mouth 2 (two) times daily with a meal. 180 tablet 3   omeprazole (PRILOSEC) 40 MG capsule TAKE 1 CAPSULE BY MOUTH EVERY MORNING 90 capsule 2   rosuvastatin (CRESTOR) 20 MG tablet TAKE ONE TABLET BY MOUTH EVERY DAY AS DIRECTED 90 tablet 2   zolpidem (AMBIEN) 10 MG tablet TAKE ONE TABLET BY MOUTH AT BEDTIME AS NEEDED 30 tablet 3   No facility-administered medications prior to visit.    Allergies  Allergen Reactions   Codeine  Itching   Sulfa Antibiotics Other (See Comments)    Review of Systems  Constitutional:  Positive for fatigue.  HENT: Negative.    Eyes:  Positive for visual disturbance.  Respiratory: Negative.    Cardiovascular: Negative.   Gastrointestinal:  Negative for abdominal pain, diarrhea, nausea and vomiting.  Endocrine: Negative.   Genitourinary: Negative.   Musculoskeletal:  Positive for arthralgias, myalgias, neck pain and neck stiffness.  Skin: Negative.   Allergic/Immunologic: Positive for environmental allergies.  Neurological:  Positive for headaches.  Hematological: Negative.   Psychiatric/Behavioral:  Positive for sleep disturbance.       Objective:    Physical Exam Vitals reviewed.  Constitutional:      Appearance: Normal appearance.  Eyes:     Pupils: Pupils are equal, round, and reactive to light.  Neck:     Thyroid: No thyroid mass.  Cardiovascular:     Rate and Rhythm: Normal rate and regular rhythm.     Pulses: Normal pulses.     Heart sounds: Normal heart sounds.  Abdominal:     General: Bowel sounds are normal.  Musculoskeletal:        General: Tenderness present.     Cervical back: Spasms and tenderness present. No edema, erythema, signs of trauma, rigidity or bony tenderness. Pain with movement and muscular tenderness present. Decreased range of motion.     Thoracic back: Normal.     Lumbar back: Normal.  Lymphadenopathy:     Cervical: No cervical adenopathy.  Skin:    General: Skin is warm and dry.     Capillary Refill: Capillary refill takes less than 2 seconds.  Neurological:     General: No focal deficit present.     Mental Status: She is alert and oriented to person, place, and time.  Psychiatric:        Mood and Affect: Mood normal.        Behavior: Behavior normal.    BP 122/74   Pulse 82   Temp 97.6 F (36.4 C)   Resp 18   Ht 5' 3"  (1.6 m)   Wt 209 lb 9.6 oz (95.1 kg)   SpO2 98%   BMI 37.13 kg/m  Wt Readings from Last 3 Encounters:   04/16/21 209 lb 9.6 oz (95.1 kg)  02/03/21 205 lb (93 kg)  01/13/21 215 lb (97.5 kg)    Health Maintenance Due  Topic Date Due   Pneumococcal Vaccine 42-48 Years old (1 - PCV) Never done   OPHTHALMOLOGY EXAM  Never done   HIV Screening  Never done  Hepatitis C Screening  Never done   TETANUS/TDAP  Never done   PAP SMEAR-Modifier  Never done   COLONOSCOPY (Pts 45-33yr Insurance coverage will need to be confirmed)  Never done   Zoster Vaccines- Shingrix (1 of 2) Never done   INFLUENZA VACCINE  Never done    Lab Results  Component Value Date   TSH 0.856 08/30/2019   Lab Results  Component Value Date   WBC 8.0 01/13/2021   HGB 13.0 01/13/2021   HCT 39.2 01/13/2021   MCV 93 01/13/2021   PLT 279 01/13/2021   Lab Results  Component Value Date   NA 141 01/13/2021   K 4.4 01/13/2021   CO2 19 (L) 01/13/2021   GLUCOSE 96 01/13/2021   BUN 11 01/13/2021   CREATININE 0.70 01/13/2021   BILITOT 0.5 01/13/2021   ALKPHOS 77 01/13/2021   AST 22 01/13/2021   ALT 29 01/13/2021   PROT 6.8 01/13/2021   ALBUMIN 4.8 01/13/2021   CALCIUM 9.6 01/13/2021   ANIONGAP 9 02/17/2016   EGFR 105 01/13/2021   Lab Results  Component Value Date   CHOL 151 01/13/2021   Lab Results  Component Value Date   HDL 41 01/13/2021   Lab Results  Component Value Date   LDLCALC 40 01/13/2021   Lab Results  Component Value Date   TRIG 491 (H) 01/13/2021   Lab Results  Component Value Date   CHOLHDL 3.7 01/13/2021   Lab Results  Component Value Date   HGBA1C 4.7 (L) 01/13/2021       Assessment & Plan:   1. Neck pain on right side - predniSONE (DELTASONE) 20 MG tablet; Take 1 tablet (20 mg total) by mouth daily with breakfast. 1 po tid for 3 days then 1 po bid for 3 days then 1 po qd for 3 days  Dispense: 18 tablet; Refill: 0 - ibuprofen (ADVIL) 800 MG tablet; Take 1 tablet (800 mg total) by mouth every 8 (eight) hours as needed.  Dispense: 90 tablet; Refill: 0 - cyclobenzaprine  (FLEXERIL) 10 MG tablet; Take 1 tablet (10 mg total) by mouth 3 (three) times daily as needed for muscle spasms.  Dispense: 30 tablet; Refill: 0 - DG Cervical Spine Complete; Future    Take Ibuprofen as needed for pain as directed Take Flexeril 10 mg up to 3 times daily Take Prednisone taper as prescribed Perform neck exercises daily [place] alternate heat and ice to right neck area Follow-up in 2 weeks       Follow-up: 2-weeks  An After Visit Summary was printed and given to the patient.  I, SRip Harbour NP, have reviewed all documentation for this visit. The documentation on 04/16/21 for the exam, diagnosis, procedures, and orders are all accurate and complete.    Signed, SRip Harbour NP [facility]([phone]

## 2021-04-23 ENCOUNTER — Other Ambulatory Visit: Payer: Self-pay

## 2021-04-23 DIAGNOSIS — M542 Cervicalgia: Secondary | ICD-10-CM

## 2021-04-27 ENCOUNTER — Ambulatory Visit: Payer: 59 | Admitting: Legal Medicine

## 2021-05-03 ENCOUNTER — Other Ambulatory Visit: Payer: Self-pay

## 2021-05-03 ENCOUNTER — Ambulatory Visit: Payer: 59 | Admitting: Nurse Practitioner

## 2021-05-03 ENCOUNTER — Encounter: Payer: Self-pay | Admitting: Nurse Practitioner

## 2021-05-03 VITALS — BP 110/78 | HR 85 | Temp 97.4°F | Ht 63.0 in | Wt 206.0 lb

## 2021-05-03 DIAGNOSIS — J309 Allergic rhinitis, unspecified: Secondary | ICD-10-CM

## 2021-05-03 DIAGNOSIS — M5030 Other cervical disc degeneration, unspecified cervical region: Secondary | ICD-10-CM

## 2021-05-03 DIAGNOSIS — J101 Influenza due to other identified influenza virus with other respiratory manifestations: Secondary | ICD-10-CM

## 2021-05-03 DIAGNOSIS — J019 Acute sinusitis, unspecified: Secondary | ICD-10-CM

## 2021-05-03 DIAGNOSIS — M542 Cervicalgia: Secondary | ICD-10-CM

## 2021-05-03 DIAGNOSIS — M5412 Radiculopathy, cervical region: Secondary | ICD-10-CM | POA: Diagnosis not present

## 2021-05-03 DIAGNOSIS — R051 Acute cough: Secondary | ICD-10-CM

## 2021-05-03 MED ORDER — PROMETHAZINE-DM 6.25-15 MG/5ML PO SYRP: 5.0000 mL | ORAL_SOLUTION | Freq: Four times a day (QID) | ORAL | 0 refills | Status: DC | PRN

## 2021-05-03 MED ORDER — AZITHROMYCIN 250 MG PO TABS: ORAL_TABLET | ORAL | 0 refills | Status: AC

## 2021-05-03 NOTE — Patient Instructions (Signed)
Take Z-pack as directed Use cough syrup up to 4 times daily We will call you with referral to spine specialist Follow-up as needed  Cervical Radiculopathy Cervical radiculopathy means that a nerve in the neck (a cervical nerve) is pinched or bruised. This can happen because of an injury to the cervical spine (vertebrae) in the neck, or as a normal part of getting older. This condition can cause pain or loss of feeling (numbness) that runs from your neck all the way down to your arm and fingers. Often, this condition gets better with rest. Treatment may be needed if the condition does not get better. What are the causes? A neck injury. A bulging disk in your spine. Sudden muscle tightening (muscle spasms). Tight muscles in your neck due to overuse. Arthritis. Breakdown in the bones and joints of the spine (spondylosis) due to getting older. Bone spurs that form near the nerves in the neck. What are the signs or symptoms? Pain. The pain may: Run from the neck to the arm and hand. Be very bad or irritating. Get worse when you move your neck. Loss of feeling or tingling in your arm or hand. Weakness in your arm or hand, in very bad cases. How is this treated? In many cases, treatment is not needed for this condition. With rest, the condition often gets better over time. If treatment is needed, options may include: Wearing a soft neck collar (cervical collar) for short periods of time. Doing exercises (physical therapy) to strengthen your neck muscles. Taking medicines. Having shots (injections) in your spine, in very bad cases. Having surgery. This may be needed if other treatments do not help. The type of surgery that is used will depend on the cause of your condition. Follow these instructions at home: If you have a soft neck collar: Wear it as told by your doctor. Take it off only as told by your doctor. Ask your doctor if you can take the collar off for cleaning and bathing. If you  are allowed to take the collar off for cleaning or bathing: Follow instructions from your doctor about how to take off the collar safely. Clean the collar by wiping it with mild soap and water and drying it completely. Take out any removable pads in the collar every 1-2 days. Wash them by hand with soap and water. Let them air-dry completely before you put them back in the collar. Check your skin under the collar for redness or sores. If you see any, tell your doctor. Managing pain   Take over-the-counter and prescription medicines only as told by your doctor. If told, put ice on the painful area. To do this: If you have a soft neck collar, take if off as told by your doctor. Put ice in a plastic bag. Place a towel between your skin and the bag. Leave the ice on for 20 minutes, 2-3 times a day. Take off the ice if your skin turns bright red. This is very important. If you cannot feel pain, heat, or cold, you have a greater risk of damage to the area. If using ice does not help, you can try using heat. Use the heat source that your doctor recommends, such as a moist heat pack or a heating pad. Place a towel between your skin and the heat source. Leave the heat on for 20-30 minutes. Take off the heat if your skin turns bright red. This is very important. If you cannot feel pain, heat, or cold, you  have a greater risk of getting burned. You may try a gentle neck and shoulder rub (massage). Activity Rest as needed. Return to your normal activities when your doctor says that it is safe. Do exercises as told by your doctor or physical therapist. You may have to avoid lifting. Ask your doctor how much you can safely lift. General instructions Use a flat pillow when you sleep. Do not drive while wearing a soft neck collar. If you do not have a soft neck collar, ask your doctor if it is safe to drive while your neck heals. Ask your doctor if you should avoid driving or using machines while you are  taking your medicine. Do not smoke or use any products that contain nicotine or tobacco. If you need help quitting, ask your doctor. Keep all follow-up visits. Contact a doctor if: Your condition does not get better with treatment. Get help right away if: Your pain gets worse and medicine does not help. You lose feeling or feel weak in your hand, arm, face, or leg. You have a high fever. Your neck is stiff. You cannot control when you poop or pee (have incontinence). You have trouble with walking, balance, or talking. Summary Cervical radiculopathy means that a nerve in the neck is pinched or bruised. A nerve can get pinched from a bulging disk, arthritis, an injury to the neck, or other causes. Symptoms include pain, tingling, or loss of feeling that goes from the neck to the arm or hand. Weakness in your arm or hand can happen in very bad cases. Treatment may include resting, wearing a soft neck collar, and doing exercises. You might need to take medicines for pain. In very bad cases, shots or surgery may be needed. This information is not intended to replace advice given to you by your health care provider. Make sure you discuss any questions you have with your health care provider. Document Revised: 11/26/2020 Document Reviewed: 11/26/2020 Elsevier Patient Education  [place].  Sinusitis, Adult Sinusitis is soreness and swelling (inflammation) of your sinuses. Sinuses are hollow spaces in the bones around your face. They are located: Around your eyes. In the middle of your forehead. Behind your nose. In your cheekbones. Your sinuses and nasal passages are lined with a fluid called mucus. Mucus drains out of your sinuses. Swelling can trap mucus in your sinuses. This lets germs (bacteria, virus, or fungus) grow, which leads to infection. Most of the time, this condition is caused by a virus. What are the causes? This condition is caused  by: Allergies. Asthma. Germs. Things that block your nose or sinuses. Growths in the nose (nasal polyps). Chemicals or irritants in the air. Fungus (rare). What increases the risk? You are more likely to develop this condition if: You have a weak body defense system (immune system). You do a lot of swimming or diving. You use nasal sprays too much. You smoke. What are the signs or symptoms? The main symptoms of this condition are pain and a feeling of pressure around the sinuses. Other symptoms include: Stuffy nose (congestion). Runny nose (drainage). Swelling and warmth in the sinuses. Headache. Toothache. A cough that may get worse at night. Mucus that collects in the throat or the back of the nose (postnasal drip). Being unable to smell and taste. Being very tired (fatigue). A fever. Sore throat. Bad breath. How is this diagnosed? This condition is diagnosed based on: Your symptoms. Your medical history. A physical exam. Tests to find out  if your condition is short-term (acute) or long-term (chronic). Your doctor may: Check your nose for growths (polyps). Check your sinuses using a tool that has a light (endoscope). Check for allergies or germs. Do imaging tests, such as an MRI or CT scan. How is this treated? Treatment for this condition depends on the cause and whether it is short-term or long-term. If caused by a virus, your symptoms should go away on their own within 10 days. You may be given medicines to relieve symptoms. They include: Medicines that shrink swollen tissue in the nose. Medicines that treat allergies (antihistamines). A spray that treats swelling of the nostrils.  Rinses that help get rid of thick mucus in your nose (nasal saline washes). If caused by bacteria, your doctor may wait to see if you will get better without treatment. You may be given antibiotic medicine if you have: A very bad infection. A weak body defense system. If caused by  growths in the nose, you may need to have surgery. Follow these instructions at home: Medicines Take, use, or apply over-the-counter and prescription medicines only as told by your doctor. These may include nasal sprays. If you were prescribed an antibiotic medicine, take it as told by your doctor. Do not stop taking the antibiotic even if you start to feel better. Hydrate and humidify  Drink enough water to keep your pee (urine) pale yellow. Use a cool mist humidifier to keep the humidity level in your home above 50%. Breathe in steam for 10-15 minutes, 3-4 times a day, or as told by your doctor. You can do this in the bathroom while a hot shower is running. Try not to spend time in cool or dry air. Rest Rest as much as you can. Sleep with your head raised (elevated). Make sure you get enough sleep each night. General instructions  Put a warm, moist washcloth on your face 3-4 times a day, or as often as told by your doctor. This will help with discomfort. Wash your hands often with soap and water. If there is no soap and water, use hand sanitizer. Do not smoke. Avoid being around people who are smoking (secondhand smoke). Keep all follow-up visits as told by your doctor. This is important. Contact a doctor if: You have a fever. Your symptoms get worse. Your symptoms do not get better within 10 days. Get help right away if: You have a very bad headache. You cannot stop throwing up (vomiting). You have very bad pain or swelling around your face or eyes. You have trouble seeing. You feel confused. Your neck is stiff. You have trouble breathing. Summary Sinusitis is swelling of your sinuses. Sinuses are hollow spaces in the bones around your face. This condition is caused by tissues in your nose that become inflamed or swollen. This traps germs. These can lead to infection. If you were prescribed an antibiotic medicine, take it as told by your doctor. Do not stop taking it even if  you start to feel better. Keep all follow-up visits as told by your doctor. This is important. This information is not intended to replace advice given to you by your health care provider. Make sure you discuss any questions you have with your health care provider. Document Revised: 10/23/2017 Document Reviewed: 10/23/2017 Elsevier Patient Education  2022 [employer].

## 2021-05-03 NOTE — Progress Notes (Signed)
Subjective:  Patient ID: Madison Carey, female    DOB: 1970-11-10  Age: 50 y.o. MRN: [id number]  Chief Complaint  Patient presents with  . Sinusitis    HPI  Madison Carey is a 50 year old Caucasian female that presents after recent influenza diagnosis on 04/26/21. She was seen at Urgent Care in [place]. States she was not prescribed any pharmacological treatment. She has take OTC Mucinex and Sudafed. States she uses Flonase nasal spray daily for chronic allergic rhinitis. States she has continued to have sinus drainage, headache, cough, hoarseness, and fatigue. Fever has subsided. Appetite has been fair.    Neck pain, follow-up: Madison Carey has experienced chronic neck pain with radiculopathy for approximately 47-months. She has been treated by a chiropractor with minimal relief. She was prescribed a course of Prednisone on 04/16/21. Cervical spine x-ray revealed on DDD ov C4-C5, C5-C6, and spurring. She has continued treatment with NSAIDS, Flexeril, and ROM exercises. She has requested referral to a spine specialist for further management.   Current Outpatient Medications on File Prior to Visit  Medication Sig Dispense Refill  . cyclobenzaprine (FLEXERIL) 10 MG tablet Take 1 tablet (10 mg total) by mouth 3 (three) times daily as needed for muscle spasms. 30 tablet 0  . ibuprofen (ADVIL) 800 MG tablet Take 1 tablet (800 mg total) by mouth every 8 (eight) hours as needed. 90 tablet 0  . metFORMIN (GLUCOPHAGE) 500 MG tablet Take 1 tablet (500 mg total) by mouth 2 (two) times daily with a meal. 180 tablet 3  . omeprazole (PRILOSEC) 40 MG capsule TAKE 1 CAPSULE BY MOUTH EVERY MORNING 90 capsule 2  . rosuvastatin (CRESTOR) 20 MG tablet TAKE ONE TABLET BY MOUTH EVERY DAY AS DIRECTED 90 tablet 2  . zolpidem (AMBIEN) 10 MG tablet TAKE ONE TABLET BY MOUTH AT BEDTIME AS NEEDED 30 tablet 3   No current facility-administered medications on file prior to visit.   Past Medical History:  Diagnosis Date  .  Adjustment insomnia   . GERD (gastroesophageal reflux disease)   . Hypertension   . Impaired fasting glucose   . Mixed hyperlipidemia   . Other vitamin B12 deficiency anemias   . Plantar fascial fibromatosis   . Right arm fracture    Past Surgical History:  Procedure Laterality Date  . ABDOMINAL HYSTERECTOMY    . bladder tacking     . CHOLECYSTECTOMY    . crevical ablation     . CYSTOSCOPY N/A 02/16/2016   Procedure: CYSTOSCOPY;  Surgeon: Bjorn Loser, MD;  Location: [facility];  Service: Urology;  Laterality: N/A;  . REVISION URINARY SLING N/A 02/16/2016   Procedure: REMOVAL URINARY SLING;  Surgeon: Bjorn Loser, MD;  Location: [facility];  Service: Urology;  Laterality: N/A;    Family History  Problem Relation Age of Onset  . COPD Mother   . Asthma Mother   . Heart disease Father   . Hyperlipidemia Father   . Diabetes Father   . Colon cancer Neg Hx   . Pancreatic cancer Neg Hx   . Stomach cancer Neg Hx   . Liver disease Neg Hx    Social History   Socioeconomic History  . Marital status: Married    Spouse name: Not on file  . Number of children: 3  . Years of education: Not on file  . Highest education level: Not on file  Occupational History  . Occupation: Engineer, building services  Tobacco Use  . Smoking status: Former    Packs/day: 0.25  Years: 5.00    Pack years: 1.25    Types: Cigarettes    Quit date: 2020    Years since quitting: 2.9  . Smokeless tobacco: Never  Vaping Use  . Vaping Use: Every day  . Substances: Nicotine, Flavoring  Substance and Sexual Activity  . Alcohol use: Yes    Alcohol/week: 2.0 standard drinks    Types: 2 Glasses of wine per week    Comment: occasionally  . Drug use: No  . Sexual activity: Yes    Partners: Male  Other Topics Concern  . Not on file  Social History Narrative  . Not on file   Social Determinants of Health   Financial Resource Strain: Not on file  Food Insecurity: Not on file  Transportation Needs: Not on file   Physical Activity: Not on file  Stress: Not on file  Social Connections: Not on file    Review of Systems  Constitutional:  Negative for chills, fatigue and fever.  HENT:  Positive for congestion, postnasal drip, rhinorrhea, sinus pressure, sinus pain and voice change (hoarseness). Negative for ear pain and sore throat.   Eyes: Negative.   Respiratory:  Negative for cough and shortness of breath.   Cardiovascular:  Negative for chest pain.  Gastrointestinal:  Negative for diarrhea and nausea.  Endocrine: Negative.   Allergic/Immunologic: Positive for environmental allergies.  Neurological:  Positive for headaches. Negative for dizziness.    Objective:  Pulse 85   Temp (!) 97.4 F (36.3 C)   Ht 5\' 3"  (1.6 m)   Wt 206 lb (93.4 kg)   SpO2 100%   BMI 36.49 kg/m   BP/Weight 05/03/2021 04/16/2021 08/30/7122  Systolic BP - 580 998  Diastolic BP - 74 80  Wt. (Lbs) 206 209.6 205  BMI 36.49 37.13 36.31    Physical Exam Vitals reviewed.  Constitutional:      Appearance: Normal appearance.  HENT:     Head: Normocephalic.     Right Ear: Tympanic membrane normal.     Left Ear: Tympanic membrane normal.     Nose: Congestion and rhinorrhea present.     Right Sinus: Maxillary sinus tenderness and frontal sinus tenderness present.     Left Sinus: Maxillary sinus tenderness and frontal sinus tenderness present.     Mouth/Throat:     Mouth: Mucous membranes are moist.     Pharynx: Posterior oropharyngeal erythema present.  Eyes:     Pupils: Pupils are equal, round, and reactive to light.  Cardiovascular:     Rate and Rhythm: Normal rate and regular rhythm.     Pulses: Normal pulses.     Heart sounds: Normal heart sounds.  Pulmonary:     Effort: Pulmonary effort is normal.     Breath sounds: Normal breath sounds.  Abdominal:     General: Bowel sounds are normal.     Palpations: Abdomen is soft.  Musculoskeletal:        General: Tenderness present.     Cervical back: Neck  supple.  Skin:    General: Skin is warm and dry.     Capillary Refill: Capillary refill takes less than 2 seconds.  Neurological:     General: No focal deficit present.     Mental Status: She is alert and oriented to person, place, and time.  Psychiatric:        Mood and Affect: Mood normal.        Behavior: Behavior normal.      Lab Results  Component Value Date   WBC 8.0 01/13/2021   HGB 13.0 01/13/2021   HCT 39.2 01/13/2021   PLT 279 01/13/2021   GLUCOSE 96 01/13/2021   CHOL 151 01/13/2021   TRIG 491 (H) 01/13/2021   HDL 41 01/13/2021   LDLCALC 40 01/13/2021   ALT 29 01/13/2021   AST 22 01/13/2021   NA 141 01/13/2021   K 4.4 01/13/2021   CL 105 01/13/2021   CREATININE 0.70 01/13/2021   BUN 11 01/13/2021   CO2 19 (L) 01/13/2021   TSH 0.856 08/30/2019   HGBA1C 4.7 (L) 01/13/2021   MICROALBUR 80 01/13/2021      Assessment & Plan:    1. Acute non-recurrent sinusitis, unspecified location - azithromycin (ZITHROMAX) 250 MG tablet; Take 2 tablets on day 1, then 1 tablet daily on days 2 through 5  Dispense: 6 tablet; Refill: 0  2. Chronic allergic rhinitis -continue Flonase spray daily  3. Neck pain on right side - Ambulatory referral to Spine Surgery  4. Influenza A -rest and push fluids  5. Numbness and tingling of right arm - Ambulatory referral to Spine Surgery  6. Acute cough - promethazine-dextromethorphan (PROMETHAZINE-DM) 6.25-15 MG/5ML syrup; Take 5 mLs by mouth 4 (four) times daily as needed.  Dispense: 118 mL; Refill: 0     Take Z-pack as directed Use cough syrup up to 4 times daily We will call you with referral to spine specialist Follow-up as needed  Follow-up: PRN  An After Visit Summary was printed and given to the patient.  I, Rip Harbour, NP, have reviewed all documentation for this visit. The documentation on 05/03/21 for the exam, diagnosis, procedures, and orders are all accurate and complete.    Signed, Rip Harbour,  NP [place] [phone]

## 2021-05-10 ENCOUNTER — Telehealth: Payer: Self-pay | Admitting: Gastroenterology

## 2021-05-10 MED ORDER — SUTAB 1479-225-188 MG PO TABS: 1.0000 | ORAL_TABLET | ORAL | 0 refills | Status: DC

## 2021-05-10 NOTE — Telephone Encounter (Signed)
Script of sutab and instructions sent for patient  she is aware.

## 2021-05-10 NOTE — Telephone Encounter (Signed)
Inbound call from patient have upcoming endo colon 12/13 states clenpiq has expired and need a new new prescription but patient would rather have pills for her prep.

## 2021-05-18 ENCOUNTER — Encounter: Payer: Self-pay | Admitting: Gastroenterology

## 2021-05-18 ENCOUNTER — Ambulatory Visit (AMBULATORY_SURGERY_CENTER): Payer: 59 | Admitting: Gastroenterology

## 2021-05-18 ENCOUNTER — Other Ambulatory Visit: Payer: Self-pay

## 2021-05-18 VITALS — BP 139/88 | HR 93 | Temp 97.8°F | Resp 18 | Ht 63.0 in | Wt 205.0 lb

## 2021-05-18 DIAGNOSIS — K2970 Gastritis, unspecified, without bleeding: Secondary | ICD-10-CM | POA: Diagnosis not present

## 2021-05-18 DIAGNOSIS — R197 Diarrhea, unspecified: Secondary | ICD-10-CM

## 2021-05-18 DIAGNOSIS — K219 Gastro-esophageal reflux disease without esophagitis: Secondary | ICD-10-CM | POA: Diagnosis not present

## 2021-05-18 DIAGNOSIS — K529 Noninfective gastroenteritis and colitis, unspecified: Secondary | ICD-10-CM | POA: Diagnosis not present

## 2021-05-18 DIAGNOSIS — K640 First degree hemorrhoids: Secondary | ICD-10-CM | POA: Diagnosis not present

## 2021-05-18 DIAGNOSIS — D124 Benign neoplasm of descending colon: Secondary | ICD-10-CM

## 2021-05-18 DIAGNOSIS — Z1211 Encounter for screening for malignant neoplasm of colon: Secondary | ICD-10-CM

## 2021-05-18 DIAGNOSIS — K5730 Diverticulosis of large intestine without perforation or abscess without bleeding: Secondary | ICD-10-CM

## 2021-05-18 DIAGNOSIS — R112 Nausea with vomiting, unspecified: Secondary | ICD-10-CM

## 2021-05-18 DIAGNOSIS — K319 Disease of stomach and duodenum, unspecified: Secondary | ICD-10-CM

## 2021-05-18 DIAGNOSIS — K2950 Unspecified chronic gastritis without bleeding: Secondary | ICD-10-CM | POA: Diagnosis not present

## 2021-05-18 MED ORDER — SODIUM CHLORIDE 0.9 % IV SOLN: 500.0000 mL | Freq: Once | INTRAVENOUS | Status: DC

## 2021-05-18 MED ORDER — SODIUM CHLORIDE 0.9 % IV SOLN
4.0000 mg | Freq: Once | INTRAVENOUS | Status: AC
Start: 2021-05-18 — End: 2021-05-18
  Administered 2021-05-18: 4 mg via INTRAVENOUS

## 2021-05-18 NOTE — Progress Notes (Signed)
Called to room to assist during endoscopic procedure.  Patient ID and intended procedure confirmed with present staff. Received instructions for my participation in the procedure from the performing physician.  

## 2021-05-18 NOTE — Patient Instructions (Signed)
YOU HAD AN ENDOSCOPIC PROCEDURE TODAY AT THE [facility]:   Refer to the procedure report that was given to you for any specific questions about what was found during the examination.  If the procedure report does not answer your questions, please call your gastroenterologist to clarify.  If you requested that your care partner not be given the details of your procedure findings, then the procedure report has been included in a sealed envelope for you to review at your convenience later.  YOU SHOULD EXPECT: Some feelings of bloating in the abdomen. Passage of more gas than usual.  Walking can help get rid of the air that was put into your GI tract during the procedure and reduce the bloating. If you had a lower endoscopy (such as a colonoscopy or flexible sigmoidoscopy) you may notice spotting of blood in your stool or on the toilet paper. If you underwent a bowel prep for your procedure, you may not have a normal bowel movement for a few days.  Please Note:  You might notice some irritation and congestion in your nose or some drainage.  This is from the oxygen used during your procedure.  There is no need for concern and it should clear up in a day or so.  SYMPTOMS TO REPORT IMMEDIATELY:   Following lower endoscopy (colonoscopy or flexible sigmoidoscopy):  Excessive amounts of blood in the stool  Significant tenderness or worsening of abdominal pains  Swelling of the abdomen that is new, acute  Fever of 100F or higher   Following upper endoscopy (EGD)  Vomiting of blood or coffee ground material  New chest pain or pain under the shoulder blades  Painful or persistently difficult swallowing  New shortness of breath  Fever of 100F or higher  Black, tarry-looking stools  For urgent or emergent issues, a gastroenterologist can be reached at any hour by calling [phone]. Do not use MyChart messaging for urgent concerns.    DIET:  We do recommend a small meal at first, but  then you may proceed to your regular diet.  Drink plenty of fluids but you should avoid alcoholic beverages for 24 hours.  ACTIVITY:  You should plan to take it easy for the rest of today and you should NOT DRIVE or use heavy machinery until tomorrow (because of the sedation medicines used during the test).    FOLLOW UP: Our staff will call the number listed on your records 48-72 hours following your procedure to check on you and address any questions or concerns that you may have regarding the information given to you following your procedure. If we do not reach you, we will leave a message.  We will attempt to reach you two times.  During this call, we will ask if you have developed any symptoms of COVID 19. If you develop any symptoms (ie: fever, flu-like symptoms, shortness of breath, cough etc.) before then, please call [phone].  If you test positive for Covid 19 in the 2 weeks post procedure, please call and report this information to us.    If any biopsies were taken you will be contacted by phone or by letter within the next 1-3 weeks.  Please call us at [phone] if you have not heard about the biopsies in 3 weeks.    SIGNATURES/CONFIDENTIALITY: You and/or your care partner have signed paperwork which will be entered into your electronic medical record.  These signatures attest to the fact that that the information above on   your After Visit Summary has been reviewed and is understood.  Full responsibility of the confidentiality of this discharge information lies with you and/or your care-partner. 

## 2021-05-18 NOTE — Progress Notes (Signed)
Zofran 4 mg IV given for one episode of nausea with vomiting

## 2021-05-18 NOTE — Progress Notes (Signed)
Chief Complaint: For GI evaluation  Referring Provider:  Lillard Anes,*      ASSESSMENT AND PLAN;   #1. GERD with HH  #2. Epi pain. S/P lap chole  #3.  Colorectal cancer screening  #3. IBS-D. With assoc post-cholecystectomy diarrhea and d/t metformin.  Plan: -EGD with SB bx/colon -Continue omeprazole -Can take imodium PRN -If continued pain, consider solid-phase GES followed by CT AP, if needed.   I discussed EGD/Colonoscopy- the indications, risks, alternatives and potential complications including, but not limited to, bleeding, infection, reaction to medication, damage to internal organs, cardiac and/or pulmonary problems, and perforation requiring surgery (1 to 2 in 1000). The possibility that significant findings could be missed was explained. All ? were answered. The patient gives consent to proceed. HPI:    Madison Carey is a 50 y.o. female  Hyperinsulinemia d/t metabolic syndrome (potential diabetic), HLD, insomnia.  C/O longstanding GERD (s/p EGD Dr Jerilynn Mages- 14 yrs ago, small HH) without odynophagia or dysphagia.  Has been on omeprazole 20 mg p.o. once daily for last several years.  She weaned herself off as she was concerned regarding side effects.  Currently using metoprolol on as-needed basis.  C/O Epi pain x yrs, worse after eating.  Nonradiating.  Feels like food sticks in the stomach for longer time.  Denies any nausea or vomiting.  Longstanding diarrhea 5-6/day, after eating, worse since metformin. No noct s/s. No wt loss. No blood in the stool.  Does have lactose intolerance as problems drinking milk.  No problems with foods containing gluten.  Been advised to get EGD and colonoscopy performed.  No sodas, chocolates, chewing gums, artificial sweeteners and candy. No NSAIDs  No weight loss.  No jaundice dark urine or pale stools.  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 Past Medical History:  Diagnosis Date   Adjustment insomnia    Diabetes  mellitus without complication (HCC)    GERD (gastroesophageal reflux disease)    Hypertension    Impaired fasting glucose    Mixed hyperlipidemia    Other vitamin B12 deficiency anemias    Plantar fascial fibromatosis    Right arm fracture     Past Surgical History:  Procedure Laterality Date   ABDOMINAL HYSTERECTOMY     bladder tacking      CHOLECYSTECTOMY     crevical ablation      CYSTOSCOPY N/A 02/16/2016   Procedure: CYSTOSCOPY;  Surgeon: Bjorn Loser, MD;  Location: [facility];  Service: Urology;  Laterality: N/A;   REVISION URINARY SLING N/A 02/16/2016   Procedure: REMOVAL URINARY SLING;  Surgeon: Bjorn Loser, MD;  Location: [facility];  Service: Urology;  Laterality: N/A;    Family History  Problem Relation Age of Onset   COPD Mother    Asthma Mother    Heart disease Father    Hyperlipidemia Father    Diabetes Father    Colon cancer Neg Hx    Pancreatic cancer Neg Hx    Stomach cancer Neg Hx    Liver disease Neg Hx     Social History   Tobacco Use   Smoking status: Former    Packs/day: 0.25    Years: 5.00    Pack years: 1.25    Types: Cigarettes    Quit date: 2020    Years since quitting: 2.9   Smokeless tobacco: Never  Vaping Use   Vaping Use: Every day   Substances: Nicotine, Flavoring  Substance Use Topics   Alcohol use: Yes  Alcohol/week: 2.0 standard drinks    Types: 2 Glasses of wine per week    Comment: occasionally   Drug use: No    Current Outpatient Medications  Medication Sig Dispense Refill   cyclobenzaprine (FLEXERIL) 10 MG tablet Take 1 tablet (10 mg total) by mouth 3 (three) times daily as needed for muscle spasms. 30 tablet 0   ibuprofen (ADVIL) 800 MG tablet Take 1 tablet (800 mg total) by mouth every 8 (eight) hours as needed. 90 tablet 0   metFORMIN (GLUCOPHAGE) 500 MG tablet Take 1 tablet (500 mg total) by mouth 2 (two) times daily with a meal. 180 tablet 3   omeprazole (PRILOSEC) 40 MG capsule TAKE 1 CAPSULE BY MOUTH EVERY  MORNING 90 capsule 2   promethazine-dextromethorphan (PROMETHAZINE-DM) 6.25-15 MG/5ML syrup Take 5 mLs by mouth 4 (four) times daily as needed. 118 mL 0   rosuvastatin (CRESTOR) 20 MG tablet TAKE ONE TABLET BY MOUTH EVERY DAY AS DIRECTED 90 tablet 2   zolpidem (AMBIEN) 10 MG tablet TAKE ONE TABLET BY MOUTH AT BEDTIME AS NEEDED 30 tablet 3   Current Facility-Administered Medications  Medication Dose Route Frequency Provider Last Rate Last Admin   0.9 %  sodium chloride infusion  500 mL Intravenous Once Jackquline Denmark, MD        Allergies  Allergen Reactions   Codeine Itching   Sulfa Antibiotics Other (See Comments)    Review of Systems:  Constitutional: Denies fever, chills, diaphoresis, appetite change and fatigue.  HEENT: Denies photophobia, eye pain, redness, hearing loss, ear pain, congestion, sore throat, rhinorrhea, sneezing, mouth sores, neck pain, neck stiffness and tinnitus.   Respiratory: Denies SOB, DOE, cough, chest tightness,  and wheezing.   Cardiovascular: Denies chest pain, palpitations and leg swelling.  Genitourinary: Denies dysuria, urgency, frequency, hematuria, flank pain and difficulty urinating.  Musculoskeletal: Denies myalgias, back pain, joint swelling, arthralgias and gait problem.  Skin: No rash.  Neurological: Denies dizziness, seizures, syncope, weakness, light-headedness, numbness and headaches.  Hematological: Denies adenopathy. Easy bruising, personal or family bleeding history  Psychiatric/Behavioral: has insomnia.  No anxiety or depression     Physical Exam:    BP (!) 154/91    Pulse 85    Temp 97.8 F (36.6 C)    Resp 16    Ht 5\' 3"  (1.6 m)    Wt 205 lb (93 kg)    SpO2 100%    BMI 36.31 kg/m  Wt Readings from Last 3 Encounters:  05/18/21 205 lb (93 kg)  05/03/21 206 lb (93.4 kg)  04/16/21 209 lb 9.6 oz (95.1 kg)   Constitutional:  Well-developed, in no acute distress. Psychiatric: Normal mood and affect. Behavior is normal. HEENT: Pupils  normal.  Conjunctivae are normal. No scleral icterus. Neck supple.  Cardiovascular: Normal rate, regular rhythm. No edema Pulmonary/chest: Effort normal and breath sounds normal. No wheezing, rales or rhonchi. Abdominal: Soft, nondistended. Nontender. Bowel sounds active throughout. There are no masses palpable. No hepatomegaly. Rectal: Deferred Neurological: Alert and oriented to person place and time. Skin: Skin is warm and dry. No rashes noted.  Data Reviewed: I have personally reviewed following labs and imaging studies  CBC: CBC Latest Ref Rng & Units 01/13/2021 07/16/2020 01/14/2020  WBC 3.4 - 10.8 x10E3/uL 8.0 7.1 6.0  Hemoglobin 11.1 - 15.9 g/dL 13.0 12.8 12.0  Hematocrit 34.0 - 46.6 % 39.2 37.3 36.6  Platelets 150 - 450 x10E3/uL 279 272 232    CMP: CMP Latest Ref Rng & Units  01/13/2021 12/09/2020 08/26/2020  Glucose 65 - 99 mg/dL 96 138(H) 95  BUN 6 - 24 mg/dL 11 - 11  Creatinine 0.57 - 1.00 mg/dL 0.70 - 0.73  Sodium 134 - 144 mmol/L 141 - 138  Potassium 3.5 - 5.2 mmol/L 4.4 - 4.1  Chloride 96 - 106 mmol/L 105 - 101  CO2 20 - 29 mmol/L 19(L) - 21  Calcium 8.7 - 10.2 mg/dL 9.6 - 9.7  Total Protein 6.0 - 8.5 g/dL 6.8 - 7.1  Total Bilirubin 0.0 - 1.2 mg/dL 0.5 - 0.5  Alkaline Phos 44 - 121 IU/L 77 - 77  AST 0 - 40 IU/L 22 - 26  ALT 0 - 32 IU/L 29 - 30      Carmell Austria, MD 05/18/2021, 2:49 PM  Cc: Lillard Anes

## 2021-05-18 NOTE — Op Note (Signed)
[place] Patient Name: Madison Carey Procedure Date: 05/18/2021 2:46 PM MRN: [id number] Endoscopist: Jackquline Denmark , MD Age: 50 Referring MD:  Date of Birth: 1971/03/29 Gender: Female Account #: [id number] Procedure:                Colonoscopy Indications:              Screening for colorectal malignant neoplasm. H/O                            diarrhea Medicines:                Monitored Anesthesia Care Procedure:                Pre-Anesthesia Assessment:                           - Prior to the procedure, a History and Physical                            was performed, and patient medications and                            allergies were reviewed. The patient's tolerance of                            previous anesthesia was also reviewed. The risks                            and benefits of the procedure and the sedation                            options and risks were discussed with the patient.                            All questions were answered, and informed consent                            was obtained. Prior Anticoagulants: The patient has                            taken no previous anticoagulant or antiplatelet                            agents. ASA Grade Assessment: II - A patient with                            mild systemic disease. After reviewing the risks                            and benefits, the patient was deemed in                            satisfactory condition to undergo the procedure.  After obtaining informed consent, the colonoscope                            was passed under direct vision. Throughout the                            procedure, the patient's blood pressure, pulse, and                            oxygen saturations were monitored continuously. The                            Olympus CF-HQ190L [id number]) Colonoscope was                            introduced through the anus and advanced to the 2                             cm into the ileum. The colonoscopy was performed                            without difficulty. The patient tolerated the                            procedure well. The quality of the bowel                            preparation was good. The terminal ileum, ileocecal                            valve, appendiceal orifice, and rectum were                            photographed. Scope In: 3:11:59 PM Scope Out: 3:28:19 PM Scope Withdrawal Time: 0 hours 11 minutes 52 seconds  Total Procedure Duration: 0 hours 16 minutes 20 seconds  Findings:                 A 6 mm polyp was found in the proximal descending                            colon. The polyp was sessile. The polyp was removed                            with a cold snare. Resection and retrieval were                            complete.                           The colon (entire examined portion) appeared                            normal. Biopsies for histology were taken with a  cold forceps from the entire colon for evaluation                            of microscopic colitis.                           A few rare small-mouthed diverticula were found in                            the sigmoid colon.                           Non-bleeding internal hemorrhoids were found during                            retroflexion. The hemorrhoids were small and Grade                            I (internal hemorrhoids that do not prolapse).                           The terminal ileum appeared normal.                           The exam was otherwise without abnormality on                            direct and retroflexion views. Complications:            No immediate complications. Estimated Blood Loss:     Estimated blood loss: none. Impression:               - One 6 mm polyp in the proximal descending colon,                            removed with a cold snare. Resected and retrieved.                            - Minimal sigmoid diverticulosis.                           - Non-bleeding internal hemorrhoids.                           - The examined portion of the ileum was normal.                           - The examination was otherwise normal on direct                            and retroflexion views. Recommendation:           - Patient has a contact number available for                            emergencies. The signs and symptoms of potential  delayed complications were discussed with the                            patient. Return to normal activities tomorrow.                            Written discharge instructions were provided to the                            patient.                           - Resume previous diet.                           - Continue present medications.                           - Await pathology results.                           - Repeat colonoscopy for surveillance based on                            pathology results.                           - The findings and recommendations were discussed                            with the patient's family. Jackquline Denmark, MD 05/18/2021 3:36:23 PM This report has been signed electronically.

## 2021-05-18 NOTE — Op Note (Signed)
[facility] Patient Name: Madison Carey Procedure Date: 05/18/2021 2:48 PM MRN: [id number] Endoscopist: Jackquline Denmark , MD Age: 50 Referring MD:  Date of Birth: July 23, 1970 Gender: Female Account #: [id number] Procedure:                Upper GI endoscopy Indications:              Epigastric abdominal pain. GERD Medicines:                Monitored Anesthesia Care Procedure:                Pre-Anesthesia Assessment:                           - Prior to the procedure, a History and Physical                            was performed, and patient medications and                            allergies were reviewed. The patient's tolerance of                            previous anesthesia was also reviewed. The risks                            and benefits of the procedure and the sedation                            options and risks were discussed with the patient.                            All questions were answered, and informed consent                            was obtained. Prior Anticoagulants: The patient has                            taken no previous anticoagulant or antiplatelet                            agents. ASA Grade Assessment: II - A patient with                            mild systemic disease. After reviewing the risks                            and benefits, the patient was deemed in                            satisfactory condition to undergo the procedure.                           After obtaining informed consent, the endoscope was  passed under direct vision. Throughout the                            procedure, the patient's blood pressure, pulse, and                            oxygen saturations were monitored continuously. The                            Endoscope was introduced through the mouth, and                            advanced to the second part of duodenum. The upper                            GI endoscopy was  accomplished without difficulty.                            The patient tolerated the procedure well. Scope In: Scope Out: Findings:                 The examined esophagus was normal with well-defined                            Z-line at 40 cm, examined by NBI.                           Scattered moderate inflammation characterized by                            erythema and granularity was found in the gastric                            body and in the gastric antrum. Biopsies were taken                            with a cold forceps for histology.                           The examined duodenum was normal. Biopsies for                            histology were taken with a cold forceps for                            evaluation of celiac disease. Complications:            No immediate complications. Estimated Blood Loss:     Estimated blood loss: none. Impression:               - Normal esophagus.                           - Gastritis. Biopsied.                           -  Normal examined duodenum. Biopsied. Recommendation:           - Patient has a contact number available for                            emergencies. The signs and symptoms of potential                            delayed complications were discussed with the                            patient. Return to normal activities tomorrow.                            Written discharge instructions were provided to the                            patient.                           - Resume previous diet.                           - Continue present medications.                           - Await pathology results.                           - The findings and recommendations were discussed                            with the patient's family. Jackquline Denmark, MD 05/18/2021 3:08:53 PM This report has been signed electronically.

## 2021-05-18 NOTE — Progress Notes (Signed)
VS by DT    

## 2021-05-18 NOTE — Progress Notes (Signed)
To pacu, VSS. Report to [email or web address] 

## 2021-05-19 ENCOUNTER — Encounter: Payer: Self-pay | Admitting: Legal Medicine

## 2021-05-19 ENCOUNTER — Ambulatory Visit (INDEPENDENT_AMBULATORY_CARE_PROVIDER_SITE_OTHER): Payer: 59 | Admitting: Legal Medicine

## 2021-05-19 VITALS — BP 110/64 | HR 90 | Temp 99.2°F | Resp 16 | Ht 63.0 in | Wt 207.0 lb

## 2021-05-19 DIAGNOSIS — F4321 Adjustment disorder with depressed mood: Secondary | ICD-10-CM | POA: Diagnosis not present

## 2021-05-19 DIAGNOSIS — K219 Gastro-esophageal reflux disease without esophagitis: Secondary | ICD-10-CM | POA: Diagnosis not present

## 2021-05-19 DIAGNOSIS — F5102 Adjustment insomnia: Secondary | ICD-10-CM

## 2021-05-19 DIAGNOSIS — E889 Metabolic disorder, unspecified: Secondary | ICD-10-CM

## 2021-05-19 DIAGNOSIS — K760 Fatty (change of) liver, not elsewhere classified: Secondary | ICD-10-CM

## 2021-05-19 DIAGNOSIS — Z6836 Body mass index (BMI) 36.0-36.9, adult: Secondary | ICD-10-CM

## 2021-05-19 DIAGNOSIS — E1169 Type 2 diabetes mellitus with other specified complication: Secondary | ICD-10-CM

## 2021-05-19 DIAGNOSIS — E782 Mixed hyperlipidemia: Secondary | ICD-10-CM

## 2021-05-19 MED ORDER — OZEMPIC (0.25 OR 0.5 MG/DOSE) 2 MG/1.5ML ~~LOC~~ SOPN: 0.5000 mg | PEN_INJECTOR | SUBCUTANEOUS | 3 refills | Status: DC

## 2021-05-19 NOTE — Progress Notes (Signed)
Subjective:  Patient ID: Madison Carey, female    DOB: 08-31-1970  Age: 50 y.o. MRN: [id number]  Chief Complaint  Patient presents with   Diabetes   Gastroesophageal Reflux    HPI: chronic visit  Patient present with type 2 diabetes.  Specifically, this is type 2, noninsulin requiring diabetes, complicated by hypertension and hypercholesterolemia.  Compliance with treatment has been good; patient take medicines as directed, maintains diet and exercise regimen, follows up as directed, and is keeping glucose diary.  Date of  diagnosis 2010.  Depression screen has been performed.Tobacco screen nonsmoker. Current medicines for diabetes metformin.  Patient is on none for renal protection and crestor for cholesterol control.  Patient performs foot exams daily and last ophthalmologic exam was needs eye exam.   Patient has gastroesophageal reflux symptoms with esophagitis and LTRD.  The symptoms are moderate intensity.  Length of symptoms 10 years.  Medicines incomeprazole lude .  Complications include none.   Patient presents with hyperlipidemia.  Compliance with treatment has been good; patient takes medicines as directed, maintains low cholesterol diet, follows up as directed, and maintains exercise regimen.  Patient is using crestor without problems.   Weight loss, wants to lose 50 lbs.  On diabetic diet.  Increase exercise.  Try ozempic.  She has fatty liver.   Current Outpatient Medications on File Prior to Visit  Medication Sig Dispense Refill   cyclobenzaprine (FLEXERIL) 10 MG tablet Take 1 tablet (10 mg total) by mouth 3 (three) times daily as needed for muscle spasms. 30 tablet 0   ibuprofen (ADVIL) 800 MG tablet Take 1 tablet (800 mg total) by mouth every 8 (eight) hours as needed. 90 tablet 0   metFORMIN (GLUCOPHAGE) 500 MG tablet Take 1 tablet (500 mg total) by mouth 2 (two) times daily with a meal. 180 tablet 3   omeprazole (PRILOSEC) 40 MG capsule TAKE 1 CAPSULE BY MOUTH EVERY  MORNING 90 capsule 2   promethazine-dextromethorphan (PROMETHAZINE-DM) 6.25-15 MG/5ML syrup Take 5 mLs by mouth 4 (four) times daily as needed. 118 mL 0   rosuvastatin (CRESTOR) 20 MG tablet TAKE ONE TABLET BY MOUTH EVERY DAY AS DIRECTED 90 tablet 2   zolpidem (AMBIEN) 10 MG tablet TAKE ONE TABLET BY MOUTH AT BEDTIME AS NEEDED 30 tablet 3   No current facility-administered medications on file prior to visit.   Past Medical History:  Diagnosis Date   Adjustment insomnia    Diabetes mellitus without complication (HCC)    GERD (gastroesophageal reflux disease)    Hypertension    Impaired fasting glucose    Mixed hyperlipidemia    Other vitamin B12 deficiency anemias    Plantar fascial fibromatosis    Right arm fracture    Past Surgical History:  Procedure Laterality Date   ABDOMINAL HYSTERECTOMY     bladder tacking      CHOLECYSTECTOMY     crevical ablation      CYSTOSCOPY N/A 02/16/2016   Procedure: CYSTOSCOPY;  Surgeon: Bjorn Loser, MD;  Location: [facility];  Service: Urology;  Laterality: N/A;   REVISION URINARY SLING N/A 02/16/2016   Procedure: REMOVAL URINARY SLING;  Surgeon: Bjorn Loser, MD;  Location: [facility];  Service: Urology;  Laterality: N/A;    Family History  Problem Relation Age of Onset   COPD Mother    Asthma Mother    Heart disease Father    Hyperlipidemia Father    Diabetes Father    Colon cancer Neg Hx  Pancreatic cancer Neg Hx    Stomach cancer Neg Hx    Liver disease Neg Hx    Social History   Socioeconomic History   Marital status: Married    Spouse name: Not on file   Number of children: 3   Years of education: Not on file   Highest education level: Not on file  Occupational History   Occupation: Engineer, building services  Tobacco Use   Smoking status: Former    Packs/day: 0.25    Years: 5.00    Pack years: 1.25    Types: Cigarettes    Quit date: 2020    Years since quitting: 2.9   Smokeless tobacco: Never  Vaping Use   Vaping Use: Every day    Substances: Nicotine, Flavoring  Substance and Sexual Activity   Alcohol use: Yes    Alcohol/week: 2.0 standard drinks    Types: 2 Glasses of wine per week    Comment: occasionally   Drug use: No   Sexual activity: Yes    Partners: Male  Other Topics Concern   Not on file  Social History Narrative   Not on file   Social Determinants of Health   Financial Resource Strain: Not on file  Food Insecurity: Not on file  Transportation Needs: Not on file  Physical Activity: Not on file  Stress: Not on file  Social Connections: Not on file    Review of Systems  Constitutional:  Negative for chills, fatigue and fever.  HENT:  Negative for congestion, ear pain and sore throat.   Respiratory:  Negative for cough and shortness of breath.   Cardiovascular:  Negative for chest pain and palpitations.  Gastrointestinal:  Negative for abdominal pain, constipation, diarrhea, nausea and vomiting.  Endocrine: Negative for polydipsia, polyphagia and polyuria.  Genitourinary:  Negative for difficulty urinating and dysuria.  Musculoskeletal:  Negative for arthralgias, back pain and myalgias.  Skin:  Negative for rash.  Neurological:  Negative for headaches.  Psychiatric/Behavioral:  Negative for dysphoric mood. The patient is not nervous/anxious.     Objective:  BP 110/64    Pulse 90    Temp 99.2 F (37.3 C)    Resp 16    Ht 5\' 3"  (1.6 m)    Wt 207 lb (93.9 kg)    BMI 36.67 kg/m   BP/Weight 05/19/2021 05/18/2021 65/46/5035  Systolic BP 465 681 275  Diastolic BP 64 88 78  Wt. (Lbs) 207 205 206  BMI 36.67 36.31 36.49    Physical Exam Vitals reviewed.  Constitutional:      General: She is not in acute distress.    Appearance: Normal appearance. She is obese.  HENT:     Head: Normocephalic.     Right Ear: Tympanic membrane, ear canal and external ear normal.     Left Ear: Tympanic membrane, ear canal and external ear normal.     Mouth/Throat:     Mouth: Mucous membranes are moist.   Eyes:     Extraocular Movements: Extraocular movements intact.     Conjunctiva/sclera: Conjunctivae normal.     Pupils: Pupils are equal, round, and reactive to light.  Cardiovascular:     Rate and Rhythm: Normal rate and regular rhythm.     Pulses: Normal pulses.     Heart sounds: Normal heart sounds. No murmur heard.   No gallop.  Pulmonary:     Effort: Pulmonary effort is normal. No respiratory distress.     Breath sounds: Normal breath sounds. No  wheezing.  Abdominal:     General: Abdomen is flat. Bowel sounds are normal. There is no distension.     Palpations: Abdomen is soft.     Tenderness: There is no abdominal tenderness.  Musculoskeletal:     Cervical back: Normal range of motion and neck supple.     Right lower leg: No edema.     Left lower leg: No edema.  Skin:    General: Skin is warm and dry.     Capillary Refill: Capillary refill takes less than 2 seconds.  Neurological:     General: No focal deficit present.     Mental Status: She is alert and oriented to person, place, and time. Mental status is at baseline.     Gait: Gait normal.     Deep Tendon Reflexes: Reflexes normal.  Psychiatric:        Mood and Affect: Mood normal.        Thought Content: Thought content normal.    Diabetic Foot Exam - Simple   Simple Foot Form Diabetic Foot exam was performed with the following findings: Yes 05/19/2021  8:26 AM  Visual Inspection No deformities, no ulcerations, no other skin breakdown bilaterally: Yes Sensation Testing Intact to touch and monofilament testing bilaterally: Yes Pulse Check Posterior Tibialis and Dorsalis pulse intact bilaterally: Yes Comments      Lab Results  Component Value Date   WBC 8.0 01/13/2021   HGB 13.0 01/13/2021   HCT 39.2 01/13/2021   PLT 279 01/13/2021   GLUCOSE 96 01/13/2021   CHOL 151 01/13/2021   TRIG 491 (H) 01/13/2021   HDL 41 01/13/2021   LDLCALC 40 01/13/2021   ALT 29 01/13/2021   AST 22 01/13/2021   NA 141  01/13/2021   K 4.4 01/13/2021   CL 105 01/13/2021   CREATININE 0.70 01/13/2021   BUN 11 01/13/2021   CO2 19 (L) 01/13/2021   TSH 0.856 08/30/2019   HGBA1C 4.7 (L) 01/13/2021   MICROALBUR 80 01/13/2021      Assessment & Plan:   Problem List Items Addressed This Visit       Digestive   Diagnoses and all orders for this visit: Gastroesophageal reflux disease without esophagitis Plan of care was formulated today.  She is doing well.  A plan of care was formulated using patient exam, tests and other sources to optimize care using evidence based information.  Recommend no smoking, no eating after supper, avoid fatty foods, elevate Head of bed, avoid tight fitting clothing.  Continue on omeprazole.   Mixed hyperlipidemia -     Lipid panel AN INDIVIDUAL CARE PLAN for hyperlipidemia/ cholesterol was established and reinforced today.  The patient's status was assessed using clinical findings on exam, lab and other diagnostic tests. The patient's disease status was assessed based on evidence-based guidelines and found to be well controlled. MEDICATIONS were reviewed. SELF MANAGEMENT GOALS have been discussed and patient's success at attaining the goal of low cholesterol was assessed. RECOMMENDATION given include regular exercise 3 days a week and low cholesterol/low fat diet. CLINICAL SUMMARY including written plan to identify barriers unique to the patient due to social or economic  reasons was discussed.   Adjustment insomnia Chronic insomnia on medicines  Adjustment disorder with depressed mood Less depression  Abnormal metabolic state in diabetes mellitus ([place]) -     Comprehensive metabolic panel -     Hemoglobin A1c -     CBC with Differential/Platelet -     Semaglutide,0.25  or 0.5MG /DOS, (OZEMPIC, 0.25 OR 0.5 MG/DOSE,) 2 MG/1.5ML SOPN; Inject 0.5 mg into the skin once a week. An individual care plan for diabetes was established and reinforced today.  The patient's status was  assessed using clinical findings on exam, labs and diagnostic testing. Patient success at meeting goals based on disease specific evidence-based guidelines and found to be good controlled. Medications were assessed and patient's understanding of the medical issues , including barriers were assessed. Recommend adherence to a diabetic diet, a graduated exercise program, HgbA1c level is checked quarterly, and urine microalbumin performed yearly .  Annual mono-filament sensation testing performed. Lower blood pressure and control hyperlipidemia is important. Get annual eye exams and annual flu shots and smoking cessation discussed.  Self management goals were discussed.   Fatty liver Patient has fatty liver, start ozempic  BMI 36.0-36.9,adult An individualize plan was formulated for obesity using patient history and physical exam to encourage weight loss.  An evidence based program was formulated.  Patient is to cut portion size with meals and to plan physical exercise 3 days a week at least 20 minutes.  Weight watchers and other programs are helpful.  Planned amount of weight loss 10 lbs.  With diabetes and hypercholesterolemia, she meets criteria for morbid obesity.  Morbid obesity ([place])  An individualize plan was formulated for obesity using patient history and physical exam to encourage weight loss.  An evidence based program was formulated.  Patient is to cut portion size with meals and to plan physical exercise 3 days a week at least 20 minutes.  Weight watchers and other programs are helpful.  Planned amount of weight loss 10 lbs. Start chronic Ozempic  30 minute visit  Reinaldo Meeker

## 2021-05-20 ENCOUNTER — Telehealth: Payer: Self-pay

## 2021-05-20 LAB — CBC WITH DIFFERENTIAL/PLATELET
Basophils Absolute: 0 10*3/uL (ref 0.0–0.2)
Basos: 0 %
EOS (ABSOLUTE): 0 10*3/uL (ref 0.0–0.4)
Eos: 1 %
Hematocrit: 34.9 % (ref 34.0–46.6)
Hemoglobin: 11.8 g/dL (ref 11.1–15.9)
Immature Grans (Abs): 0 10*3/uL (ref 0.0–0.1)
Immature Granulocytes: 0 %
Lymphocytes Absolute: 1.5 10*3/uL (ref 0.7–3.1)
Lymphs: 19 %
MCH: 30.8 pg (ref 26.6–33.0)
MCHC: 33.8 g/dL (ref 31.5–35.7)
MCV: 91 fL (ref 79–97)
Monocytes Absolute: 0.8 10*3/uL (ref 0.1–0.9)
Monocytes: 11 %
Neutrophils Absolute: 5.5 10*3/uL (ref 1.4–7.0)
Neutrophils: 69 %
Platelets: 288 10*3/uL (ref 150–450)
RBC: 3.83 x10E6/uL (ref 3.77–5.28)
RDW: 14.7 % (ref 11.7–15.4)
WBC: 7.9 10*3/uL (ref 3.4–10.8)

## 2021-05-20 LAB — COMPREHENSIVE METABOLIC PANEL
ALT: 43 IU/L — ABNORMAL HIGH (ref 0–32)
AST: 32 IU/L (ref 0–40)
Albumin/Globulin Ratio: 2 (ref 1.2–2.2)
Albumin: 4.5 g/dL (ref 3.8–4.9)
Alkaline Phosphatase: 66 IU/L (ref 44–121)
BUN/Creatinine Ratio: 9 (ref 9–23)
BUN: 7 mg/dL (ref 6–24)
Bilirubin Total: 0.7 mg/dL (ref 0.0–1.2)
CO2: 20 mmol/L (ref 20–29)
Calcium: 8.8 mg/dL (ref 8.7–10.2)
Chloride: 106 mmol/L (ref 96–106)
Creatinine, Ser: 0.77 mg/dL (ref 0.57–1.00)
Globulin, Total: 2.2 g/dL (ref 1.5–4.5)
Glucose: 102 mg/dL — ABNORMAL HIGH (ref 70–99)
Potassium: 4.1 mmol/L (ref 3.5–5.2)
Sodium: 141 mmol/L (ref 134–144)
Total Protein: 6.7 g/dL (ref 6.0–8.5)
eGFR: 93 mL/min/{1.73_m2} (ref >59)

## 2021-05-20 LAB — HEMOGLOBIN A1C
Est. average glucose Bld gHb Est-mCnc: 85 mg/dL
Hgb A1c MFr Bld: 4.6 % — ABNORMAL LOW (ref 4.8–5.6)

## 2021-05-20 LAB — LIPID PANEL
Chol/HDL Ratio: 3.7 ratio (ref 0.0–4.4)
Cholesterol, Total: 147 mg/dL (ref 100–199)
HDL: 40 mg/dL (ref >39)
LDL Chol Calc (NIH): 63 mg/dL (ref 0–99)
Triglycerides: 280 mg/dL — ABNORMAL HIGH (ref 0–149)
VLDL Cholesterol Cal: 44 mg/dL — ABNORMAL HIGH (ref 5–40)

## 2021-05-20 LAB — CARDIOVASCULAR RISK ASSESSMENT

## 2021-05-20 NOTE — Telephone Encounter (Signed)
°  Follow up Call-  Call back number 05/18/2021  Post procedure Call Back phone  # [phone]  Permission to leave phone message Yes  Some recent data might be hidden     Patient questions:  Do you have a fever, pain , or abdominal swelling? No. Pain Score  0 *  Have you tolerated food without any problems? Yes.    Have you been able to return to your normal activities? Yes.    Do you have any questions about your discharge instructions: Diet   No. Medications  No. Follow up visit  No.  Do you have questions or concerns about your Care? No.  Actions: * If pain score is 4 or above: No action needed, pain <4.

## 2021-05-20 NOTE — Progress Notes (Signed)
Glucose 102, kidney tests normal, one liver tests high, A1c 4.6, cut metformin to once a day, triglycerides 280, cbc normal lp

## 2021-05-21 ENCOUNTER — Other Ambulatory Visit: Payer: Self-pay | Admitting: Neurological Surgery

## 2021-05-21 DIAGNOSIS — M47812 Spondylosis without myelopathy or radiculopathy, cervical region: Secondary | ICD-10-CM

## 2021-05-22 ENCOUNTER — Encounter: Payer: Self-pay | Admitting: Gastroenterology

## 2021-06-07 LAB — HM DIABETES EYE EXAM

## 2021-06-16 ENCOUNTER — Ambulatory Visit
Admission: RE | Admit: 2021-06-16 | Discharge: 2021-06-16 | Disposition: A | Discharge status: Home / Self Care | Payer: 59 | Source: Ambulatory Visit | Attending: Neurological Surgery | Admitting: Neurological Surgery

## 2021-06-16 ENCOUNTER — Other Ambulatory Visit: Payer: Self-pay

## 2021-06-16 DIAGNOSIS — M47812 Spondylosis without myelopathy or radiculopathy, cervical region: Secondary | ICD-10-CM

## 2021-07-09 ENCOUNTER — Other Ambulatory Visit: Payer: Self-pay

## 2021-07-09 ENCOUNTER — Encounter: Payer: Self-pay | Admitting: Nurse Practitioner

## 2021-07-09 ENCOUNTER — Ambulatory Visit: Payer: Self-pay | Admitting: Nurse Practitioner

## 2021-07-09 ENCOUNTER — Encounter: Payer: Self-pay | Admitting: Legal Medicine

## 2021-07-09 VITALS — BP 120/80 | HR 80 | Ht 63.0 in | Wt 206.0 lb

## 2021-07-09 DIAGNOSIS — M542 Cervicalgia: Secondary | ICD-10-CM

## 2021-07-09 DIAGNOSIS — M62838 Other muscle spasm: Secondary | ICD-10-CM

## 2021-07-09 MED ORDER — BACLOFEN 10 MG PO TABS: 10.0000 mg | ORAL_TABLET | Freq: Three times a day (TID) | ORAL | 0 refills | Status: DC

## 2021-07-09 MED ORDER — IBUPROFEN 800 MG PO TABS: 800.0000 mg | ORAL_TABLET | Freq: Three times a day (TID) | ORAL | 0 refills | Status: DC | PRN

## 2021-07-09 NOTE — Patient Instructions (Addendum)
Take Ibuprofen 800 mg every 8 hours with food as needed for neck pain Take Baclofen 10 mg as needed for muscle spasms We will call you with physical therapy appointment Obtain neck x-ray at [facility] Follow-up as needed   Cervical Sprain A cervical sprain is also called a neck sprain. It is a stretch or tear in one or more ligaments in the neck. Ligaments are tissues that connect bones to each other. Neck sprains can be mild, bad, or very bad. A very bad sprain in the neck can cause the bones in the neck to be unstable. This can damage the spinal cord. It can also cause serious problems in the brain, spinal cord, and nerves (nervous system). Most neck sprains heal in 4-6 weeks. It can take more or less time depending on: What caused the injury. The amount of injury. What are the causes? Neck sprains may be caused by trauma, such as: An injury from an accident in a vehicle such as a car or boat. A fall. The head and neck being moved front to back or side to side all of a sudden (whiplash injury). Mild neck sprains may be caused by wear and tear over time. What increases the risk? The following factors may make you more likely to develop this condition: Taking part in activities that put you at high risk of hurting your neck. These include: Contact sports. Animator. Gymnastics. Diving. Taking risks when driving or riding in a vehicle such as a car or boat. Arthritis caused by wear and tear of the joints in the spine. The neck not being very strong or flexible. Having had a neck injury in the past. Poor posture. Spending a lot of time in certain positions that put stress on the neck. This may be from sitting at a computer for a long time. What are the signs or symptoms? Symptoms of this condition include: Your neck, shoulders, or upper back feeling: Painful or sore. Stiff. Tender. Swollen. Hot, or like it is burning. Sudden tightening of neck muscles (spasms). Not  being able to move the neck very much. Headache. Feeling dizzy. Feeling like you may vomit, or vomiting. Having a hand or arm that: Feels weak. Loses feeling (feels numb). Tingles. You may get symptoms right away after injury, or you may get them over a few days. In some cases, symptoms may go away with treatment and come back over time. How is this treated? This condition is treated by: Resting your neck. Icing the part of your neck that is hurt. Doing exercises to restore movement and strength to your neck (physical therapy). If there is no swelling, you may use heat therapy 2-3 days after the injury took place. If your injury is very bad, treatment may also include: Keeping your neck in place for a length of time. This may be done using: A neck collar. This supports your chin and the back of your head. A cervical traction device. This is a sling that holds up your head. The sling removes weight and pressure from your neck. It may also help to relieve pain. Medicines that help with: Pain. Irritation and swelling (inflammation). Medicines that help to relax your muscles (muscle relaxants). Surgery. This is rare. Follow these instructions at home: Medicines  Take over-the-counter and prescription medicines only as told by your doctor. Ask your doctor if the medicine prescribed to you: Requires you to avoid driving or using heavy machinery. Can cause trouble pooping (constipation). You may need to  take these actions to prevent or treat trouble pooping: Drink enough fluid to keep your pee (urine) pale yellow. Take over-the-counter or prescription medicines. Eat foods that are high in fiber. These include beans, whole grains, and fresh fruits and vegetables. Limit foods that are high in fat and sugar. These include fried or sweet foods. If you have a neck collar: Wear it as told by your doctor. Do not take it off unless told. Ask your doctor before adjusting your collar. If you have  long hair, keep it outside of the collar. Ask your doctor if you may take off the collar for cleaning and bathing. If you may take off the collar: Follow instructions about how to take it off safely. Clean it by hand with mild soap and water. Let it air-dry fully. If your collar has pads that you can take out: Take the pads out every 1-2 days. Wash them by hand with soap and water. Let the pads air-dry fully before you put them back in the collar. Tell your doctor if your skin under the collar has irritation or sores. Managing pain, stiffness, and swelling   Use a cervical traction device, if told by your doctor. If told, put ice on the affected area. To do this: Put ice in a plastic bag. Place a towel between your skin and the bag. Leave the ice on for 20 minutes, 2-3 times a day. If told, put heat on the affected area. Do this before exercise or as often as told by your doctor. Use the heat source that your doctor recommends, such as a moist heat pack or a heating pad. Place a towel between your skin and the heat source. Leave the heat on for 20-30 minutes. Take the heat off if your skin turns bright red. This is very important if you cannot feel pain, heat, or cold. You may have a greater risk of getting burned. Activity Do not drive while wearing a neck collar. If you do not have a neck collar, ask if it is safe to drive while your neck heals. Do not lift anything that is heavier than 10 lb (4.5 kg), or the limit that you are told, until your doctor tells you that it is safe. Rest as told by your doctor. Do exercises as told by your doctor or physical therapist. Return to your normal activities as told by your doctor. Avoid positions and activities that make you feel worse. Ask your doctor what activities are safe for you. General instructions Do not use any products that contain nicotine or tobacco, such as cigarettes, e-cigarettes, and chewing tobacco. These can delay healing. If you  need help quitting, ask your doctor. Keep all follow-up visits as told by your doctor or physical therapist. This is important. How is this prevented? To prevent a neck sprain from happening again: Practice good posture. Adjust your workstation to help you do this. Exercise regularly as told by your doctor or physical therapist. Avoid activities that are risky or may cause a neck sprain. Contact a doctor if: Your symptoms get worse. Your symptoms do not get better after 2 weeks of treatment. Your pain gets worse. Medicine does not help your pain. You have new symptoms that you cannot explain. Your neck collar gives you sores on your skin or bothers your skin. Get help right away if: You have very bad pain. You get any of the following in any part of your body: Loss of feeling. Tingling. Weakness. You cannot move  a part of your body. You have neck pain and either of these: Very bad dizziness. A very bad headache. Summary A cervical sprain is also called a neck sprain. It is a stretch or tear in one or more ligaments in the neck. Ligaments are tissues that connect bones. Neck sprains may be caused by trauma, such as an injury or a fall. You may get symptoms right away after injury, or you may get them over a few days. Neck sprains may be treated with rest, heat, ice, medicines, exercise, and surgery. This information is not intended to replace advice given to you by your health care provider. Make sure you discuss any questions you have with your health care provider. Document Revised: 01/30/2019 Document Reviewed: 01/30/2019 Elsevier Patient Education  2022 [employer].   Cervical Strain and Sprain Rehab Ask your health care provider which exercises are safe for you. Do exercises exactly as told by your health care provider and adjust them as directed. It is normal to feel mild stretching, pulling, tightness, or discomfort as you do these exercises. Stop right away if you feel  sudden pain or your pain gets worse. Do not begin these exercises until told by your health care provider. Stretching and range-of-motion exercises Cervical side bending  Using good posture, sit on a stable chair or stand up. Without moving your shoulders, slowly tilt your left / right ear to your shoulder until you feel a stretch in the opposite side neck muscles. You should be looking straight ahead. Hold for __________ seconds. Repeat with the other side of your neck. Repeat __________ times. Complete this exercise __________ times a day. Cervical rotation  Using good posture, sit on a stable chair or stand up. Slowly turn your head to the side as if you are looking over your left / right shoulder. Keep your eyes level with the ground. Stop when you feel a stretch along the side and the back of your neck. Hold for __________ seconds. Repeat this by turning to your other side. Repeat __________ times. Complete this exercise __________ times a day. Thoracic extension and pectoral stretch Roll a towel or a small blanket so it is about 4 inches (10 cm) in diameter. Lie down on your back on a firm surface. Put the towel lengthwise, under your spine in the middle of your back. It should not be under your shoulder blades. The towel should line up with your spine from your middle back to your lower back. Put your hands behind your head and let your elbows fall out to your sides. Hold for __________ seconds. Repeat __________ times. Complete this exercise __________ times a day. Strengthening exercises Isometric upper cervical flexion Lie on your back with a thin pillow behind your head and a small rolled-up towel under your neck. Gently tuck your chin toward your chest and nod your head down to look toward your feet. Do not lift your head off the pillow. Hold for __________ seconds. Release the tension slowly. Relax your neck muscles completely before you repeat this exercise. Repeat  __________ times. Complete this exercise __________ times a day. Isometric cervical extension  Stand about 6 inches (15 cm) away from a wall, with your back facing the wall. Place a soft object, about 6-8 inches (15-20 cm) in diameter, between the back of your head and the wall. A soft object could be a small pillow, a ball, or a folded towel. Gently tilt your head back and press into the soft object.  Keep your jaw and forehead relaxed. Hold for __________ seconds. Release the tension slowly. Relax your neck muscles completely before you repeat this exercise. Repeat __________ times. Complete this exercise __________ times a day. Posture and body mechanics Body mechanics refers to the movements and positions of your body while you do your daily activities. Posture is part of body mechanics. Good posture and healthy body mechanics can help to relieve stress in your body's tissues and joints. Good posture means that your spine is in its natural S-curve position (your spine is neutral), your shoulders are pulled back slightly, and your head is not tipped forward. The following are general guidelines for applying improved posture and body mechanics to your everyday activities. Sitting  When sitting, keep your spine neutral and keep your feet flat on the floor. Use a footrest, if necessary, and keep your thighs parallel to the floor. Avoid rounding your shoulders, and avoid tilting your head forward. When working at a desk or a computer, keep your desk at a height where your hands are slightly lower than your elbows. Slide your chair under your desk so you are close enough to maintain good posture. When working at a computer, place your monitor at a height where you are looking straight ahead and you do not have to tilt your head forward or downward to look at the screen. Standing  When standing, keep your spine neutral and keep your feet about hip-width apart. Keep a slight bend in your knees. Your  ears, shoulders, and hips should line up. When you do a task in which you stand in one place for a long time, place one foot up on a stable object that is 2-4 inches (5-10 cm) high, such as a footstool. This helps keep your spine neutral. Resting When lying down and resting, avoid positions that are most painful for you. Try to support your neck in a neutral position. You can use a contour pillow or a small rolled-up towel. Your pillow should support your neck but not push on it. This information is not intended to replace advice given to you by your health care provider. Make sure you discuss any questions you have with your health care provider. Document Revised: 09/12/2018 Document Reviewed: 02/21/2018 Elsevier Patient Education  [employer].
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
## 2021-07-12 ENCOUNTER — Other Ambulatory Visit: Payer: Self-pay | Admitting: Legal Medicine

## 2021-07-12 DIAGNOSIS — F5102 Adjustment insomnia: Secondary | ICD-10-CM

## 2021-07-12 DIAGNOSIS — E1169 Type 2 diabetes mellitus with other specified complication: Secondary | ICD-10-CM

## 2021-07-12 MED ORDER — OZEMPIC (1 MG/DOSE) 4 MG/3ML ~~LOC~~ SOPN: 1.0000 mg | PEN_INJECTOR | SUBCUTANEOUS | 3 refills | Status: DC

## 2021-07-15 ENCOUNTER — Other Ambulatory Visit: Payer: Self-pay

## 2021-07-15 ENCOUNTER — Telehealth: Payer: Self-pay

## 2021-07-15 DIAGNOSIS — M62838 Other muscle spasm: Secondary | ICD-10-CM

## 2021-07-15 DIAGNOSIS — M542 Cervicalgia: Secondary | ICD-10-CM

## 2021-07-15 NOTE — Telephone Encounter (Signed)
Left message for patient to call our office. Per [facility] xray results for cervical spine showed: " Degenerative changes C3-C4, C4-C5, C5-C6, will refer to spine specialist for further management.

## 2021-07-26 ENCOUNTER — Other Ambulatory Visit: Payer: Self-pay

## 2021-07-26 DIAGNOSIS — E889 Metabolic disorder, unspecified: Secondary | ICD-10-CM

## 2021-07-26 DIAGNOSIS — E1169 Type 2 diabetes mellitus with other specified complication: Secondary | ICD-10-CM

## 2021-07-26 MED ORDER — OZEMPIC (1 MG/DOSE) 4 MG/3ML ~~LOC~~ SOPN: 1.0000 mg | PEN_INJECTOR | SUBCUTANEOUS | 3 refills | Status: DC

## 2021-08-09 ENCOUNTER — Other Ambulatory Visit: Payer: Self-pay | Admitting: Nurse Practitioner

## 2021-08-09 DIAGNOSIS — M542 Cervicalgia: Secondary | ICD-10-CM

## 2021-09-09 ENCOUNTER — Institutional Professional Consult (permissible substitution): Payer: Self-pay | Admitting: Plastic Surgery

## 2021-09-13 ENCOUNTER — Other Ambulatory Visit: Payer: Self-pay | Admitting: Nurse Practitioner

## 2021-09-13 DIAGNOSIS — M62838 Other muscle spasm: Secondary | ICD-10-CM

## 2021-09-13 DIAGNOSIS — M542 Cervicalgia: Secondary | ICD-10-CM

## 2021-09-13 MED ORDER — BACLOFEN 10 MG PO TABS: 10.0000 mg | ORAL_TABLET | Freq: Three times a day (TID) | ORAL | 2 refills | Status: DC

## 2021-09-17 ENCOUNTER — Ambulatory Visit: Payer: 59 | Admitting: Legal Medicine

## 2021-09-17 ENCOUNTER — Encounter: Payer: Self-pay | Admitting: Nurse Practitioner

## 2021-09-17 ENCOUNTER — Ambulatory Visit: Payer: 59 | Admitting: Nurse Practitioner

## 2021-09-17 ENCOUNTER — Other Ambulatory Visit: Payer: Self-pay | Admitting: Nurse Practitioner

## 2021-09-17 VITALS — BP 110/82 | HR 90 | Temp 97.5°F | Ht 63.0 in | Wt 201.4 lb

## 2021-09-17 DIAGNOSIS — K219 Gastro-esophageal reflux disease without esophagitis: Secondary | ICD-10-CM | POA: Diagnosis not present

## 2021-09-17 DIAGNOSIS — E1169 Type 2 diabetes mellitus with other specified complication: Secondary | ICD-10-CM

## 2021-09-17 DIAGNOSIS — Z6835 Body mass index (BMI) 35.0-35.9, adult: Secondary | ICD-10-CM

## 2021-09-17 DIAGNOSIS — E889 Metabolic disorder, unspecified: Secondary | ICD-10-CM

## 2021-09-17 DIAGNOSIS — G4709 Other insomnia: Secondary | ICD-10-CM

## 2021-09-17 DIAGNOSIS — M542 Cervicalgia: Secondary | ICD-10-CM

## 2021-09-17 DIAGNOSIS — E8881 Metabolic syndrome: Secondary | ICD-10-CM

## 2021-09-17 DIAGNOSIS — Z833 Family history of diabetes mellitus: Secondary | ICD-10-CM | POA: Diagnosis not present

## 2021-09-17 DIAGNOSIS — E782 Mixed hyperlipidemia: Secondary | ICD-10-CM

## 2021-09-17 MED ORDER — BELSOMRA 10 MG PO TABS: 10.0000 mg | ORAL_TABLET | Freq: Every evening | ORAL | 0 refills | Status: DC | PRN

## 2021-09-17 MED ORDER — CYCLOBENZAPRINE HCL 10 MG PO TABS: ORAL_TABLET | ORAL | 1 refills | Status: DC

## 2021-09-17 MED ORDER — SEMAGLUTIDE (2 MG/DOSE) 8 MG/3ML ~~LOC~~ SOPN: 2.0000 mg | PEN_INJECTOR | SUBCUTANEOUS | 3 refills | Status: DC

## 2021-09-17 NOTE — Patient Instructions (Addendum)
We will call you with lab results ?Increase Ozempic to 2 mg injection weekly ?Stop Belsomra 10 mg nightly for insomnia ?Notify office immediately of adverse side effects ?Follow-up 27-month ? ?Quality Sleep Information, Adult ?Quality sleep is important for your mental and physical health. It also improves your quality of life. Quality sleep means you: ?Are asleep for most of the time you are in bed. ?Fall asleep within 30 minutes. ?Wake up no more than once a night.  ?Are awake for no longer than 20 minutes if you do wake up during the night. ?Most adults need 7-8 hours of quality sleep each night. ?How can poor sleep affect me? ?If you do not get enough quality sleep, you may have: ?Mood swings. ?Daytime sleepiness. ?Confusion. ?Decreased reaction time. ?Sleep disorders, such as insomnia and sleep apnea. ?Difficulty with: ?Solving problems. ?Coping with stress. ?Paying attention. ?These issues may affect your performance and productivity at work, school, and at home. Lack of sleep may also put you at higher risk for accidents, suicide, and risky behaviors. ?If you do not get quality sleep you may also be at higher risk for several health problems, including: ?Infections. ?Type 2 diabetes. ?Heart disease. ?High blood pressure. ?Obesity. ?Worsening of long-term conditions, like arthritis, kidney disease, depression, Parkinson's disease, and epilepsy. ?What actions can I take to get more quality sleep? ? ?  ? ?Stick to a sleep schedule. Go to sleep and wake up at about the same time each day. Do not try to sleep less on weekdays and make up for lost sleep on weekends. This does not work. ?Try to get about 30 minutes of exercise on most days. Do not exercise 2-3 hours before going to bed. ?Limit naps during the day to 30 minutes or less. ?Do not use any products that contain nicotine or tobacco, such as cigarettes or e-cigarettes. If you need help quitting, ask your health care provider. ?Do not drink caffeinated  beverages for at least 8 hours before going to bed. Coffee, tea, and some sodas contain caffeine. ?Do not drink alcohol close to bedtime. ?Do not eat large meals close to bedtime. ?Do not take naps in the late afternoon. ?Try to get at least 30 minutes of sunlight every day. Morning sunlight is best. ?Make time to relax before bed. Reading, listening to music, or taking a hot bath promotes quality sleep. ?Make your bedroom a place that promotes quality sleep. Keep your bedroom dark, quiet, and at a comfortable room temperature. Make sure your bed is comfortable. Take out sleep distractions like TV, a computer, smartphone, and bright lights. ?If you are lying awake in bed for longer than 20 minutes, get up and do a relaxing activity until you feel sleepy. ?Work with your health care provider to treat medical conditions that may affect sleeping, such as: ?Nasal obstruction. ?Snoring. ?Sleep apnea and other sleep disorders. ?Talk to your health care provider if you think any of your prescription medicines may cause you to have difficulty falling or staying asleep. ?If you have sleep problems, talk with a sleep consultant. If you think you have a sleep disorder, talk with your health care provider about getting evaluated by a specialist. ?Where to find more information ?NDeltanawebsite: https://sleepfoundation.org ?National Heart, Lung, and [place](NRed Jacket: whttp://www.saunders.info/pdf ?Centers for Disease Control and Prevention (CDC): wLearningDermatology.pl?Contact a health care provider if you: ?Have trouble getting to sleep or staying asleep. ?Often wake up very early in the morning and cannot get back to  sleep. ?Have daytime sleepiness. ?Have daytime sleep attacks of suddenly falling asleep and sudden muscle weakness (narcolepsy). ?Have a tingling sensation in your legs with a strong urge to move your legs (restless legs syndrome). ?Stop breathing  briefly during sleep (sleep apnea). ?Think you have a sleep disorder or are taking a medicine that is affecting your quality of sleep. ?Summary ?Most adults need 7-8 hours of quality sleep each night. ?Getting enough quality sleep is an important part of health and well-being. ?Make your bedroom a place that promotes quality sleep and avoid things that may cause you to have poor sleep, such as alcohol, caffeine, smoking, and large meals. ?Talk to your health care provider if you have trouble falling asleep or staying asleep. ?This information is not intended to replace advice given to you by your health care provider. Make sure you discuss any questions you have with your health care provider. ?Document Revised: 06/28/2021 Document Reviewed: 03/22/2021 ?Elsevier Patient Education ? [place]. ? ? ? ?Semaglutide Injection (Weight Management) ?What is this medication? ?SEMAGLUTIDE (SEM a GLOO tide) promotes weight loss. It may also be used to maintain weight loss. It works by decreasing appetite. Changes to diet and exercise are often combined with this medication. ?This medicine may be used for other purposes; ask your health care provider or pharmacist if you have questions. ?COMMON BRAND NAME(S): Wegovy ?What should I tell my care team before I take this medication? ?They need to know if you have any of these conditions: ?Endocrine tumors (MEN 2) or if someone in your family had these tumors ?Eye disease, vision problems ?Gallbladder disease ?History of depression or mental health disease ?History of pancreatitis ?Kidney disease ?Stomach or intestine problems ?Suicidal thoughts, plans, or attempt; a previous suicide attempt by you or a family member ?Thyroid cancer or if someone in your family had thyroid cancer ?An unusual or allergic reaction to semaglutide, other medications, foods, dyes, or preservatives ?Pregnant or trying to get pregnant ?Breast-feeding ?How should I use this medication? ?This  medication is injected under the skin. You will be taught how to prepare and give it. Take it as directed on the prescription label. It is given once every week (every 7 days). Keep taking it unless your care team tells you to stop. ?It is important that you put your used needles and pens in a special sharps container. Do not put them in a trash can. If you do not have a sharps container, call your pharmacist or care team to get one. ?A special MedGuide will be given to you by the pharmacist with each prescription and refill. Be sure to read this information carefully each time. ?This medication comes with INSTRUCTIONS FOR USE. Ask your pharmacist for directions on how to use this medication. Read the information carefully. Talk to your pharmacist or care team if you have questions. ?Talk to your care team about the use of this medication in children. While it may be prescribed for children as young as 12 years for selected conditions, precautions do apply. ?Overdosage: If you think you have taken too much of this medicine contact a poison control center or emergency room at once. ?NOTE: This medicine is only for you. Do not share this medicine with others. ?What if I miss a dose? ?If you miss a dose and the next scheduled dose is more than 2 days away, take the missed dose as soon as possible. If you miss a dose and the next scheduled dose is less  than 2 days away, do not take the missed dose. Take the next dose at your regular time. Do not take double or extra doses. If you miss your dose for 2 weeks or more, take the next dose at your regular time or call your care team to talk about how to restart this medication. ?What may interact with this medication? ?Insulin and other medications for diabetes ?This list may not describe all possible interactions. Give your health care provider a list of all the medicines, herbs, non-prescription drugs, or dietary supplements you use. Also tell them if you smoke, drink  alcohol, or use illegal drugs. Some items may interact with your medicine. ?What should I watch for while using this medication? ?Visit your care team for regular checks on your progress. It may be some time before you se

## 2021-09-17 NOTE — Progress Notes (Signed)
? ?Subjective:  ?Patient ID: Madison Carey, female    DOB: 05/20/1971  Age: 51 y.o. MRN: [id number] ? ?Chief Complaint  ?Patient presents with  ? Type 2 DM  ? Hyperlipidemia  ? Gastroesophageal Reflux  ? ?HPI ?Madison Carey is a 51 year old Caucasian female that presents for follow-up type 2 DM, hyperlipidemia, and GERD. She has chronic insomnia. Current treatment includes Ambien which she has taken for several years. She tells me she would like to discontinue Ambien due to the association with weight gain. She has lost 5 pounds since last visit. Current weight 201 lbs, BMI 35.68.  ? ? Diabetes Mellitus Type II, Follow-up ?Madison Carey was diagnosed with type 2 DM in 2022. She was found to have elevated blood glucose levels and hyperinsulinemia. She was referred to endocrinologist Renato Shin, MD. She initially was prescribed Metformin. She is currently prescribed Ozempic which she is tolerating well. She has requested an increase in dose to assist with with loss and fatty liver.  ?Lab Results  ?Component Value Date  ? HGBA1C 4.5 (L) 09/17/2021  ? HGBA1C 4.6 (L) 05/19/2021  ? HGBA1C 4.7 (L) 01/13/2021  ? ?Wt Readings from Last 3 Encounters:  ?09/17/21 201 lb 6.4 oz (91.4 kg)  ?07/09/21 206 lb (93.4 kg)  ?05/19/21 207 lb (93.9 kg)  ? ?Last seen for diabetes 3 months ago.  ?Management since then includes Ozempic. ?She reports excellent compliance with treatment. ?She is not having side effects.  ?Symptoms: ?No fatigue No foot ulcerations  ?No appetite changes No nausea  ?No paresthesia of the feet  No polydipsia  ?No polyuria No visual disturbances   ?No vomiting   ? ? ?Home blood sugar records:  are not being checked ? ?Episodes of hypoglycemia? No  ?  ?Current insulin regiment:none ?Most Recent Eye Exam: 2022 ?Current exercise: walking ?Current diet habits: in general, a "healthy" diet   ? ?Pertinent Labs: ?Lab Results  ?Component Value Date  ? CHOL 144 09/17/2021  ? HDL 41 09/17/2021  ? [place] 64 09/17/2021  ? TRIG 245 (H)  09/17/2021  ? CHOLHDL 3.5 09/17/2021  ? Lab Results  ?Component Value Date  ? NA 143 09/17/2021  ? K 4.3 09/17/2021  ? CREATININE 0.75 09/17/2021  ? EGFR 96 09/17/2021  ? GFRNONAA 94 07/16/2020  ? GLUCOSE 95 09/17/2021  ?  ? ? ?GERD, Follow up: ? ?The patient was last seen for GERD 4 months ago. ?Changes made since that visit include omeprazole 40 mg daily. ? ?She reports good compliance with treatment. ?She is not having side effects. ? ?She IS experiencing belching. ?She is NOT experiencing heartburn ?Lipid/Cholesterol, Follow-up ? ?Last lipid panel Other pertinent labs  ?Lab Results  ?Component Value Date  ? CHOL 147 05/19/2021  ? HDL 40 05/19/2021  ? [place] 63 05/19/2021  ? TRIG 280 (H) 05/19/2021  ? CHOLHDL 3.7 05/19/2021  ? Lab Results  ?Component Value Date  ? ALT 43 (H) 05/19/2021  ? AST 32 05/19/2021  ? PLT 288 05/19/2021  ? TSH 0.856 08/30/2019  ?  ? ?She was last seen for this 4 months ago.  ?Management since that visit includes Crestor 20 mg daily. ? ?She reports fair compliance with treatment. ?She is not having side effects.  ? ?Symptoms: ?No chest pain No chest pressure/discomfort  ?No dyspnea No lower extremity edema  ?No numbness or tingling of extremity No orthopnea  ?No palpitations No paroxysmal nocturnal dyspnea  ?No speech difficulty No syncope  ? ?Current diet: in general,  a "healthy" diet   ?Current exercise: walking ? ?The 10-year ASCVD risk score (Arnett DK, et al., 2019) is: 2.5% ? ?Current Outpatient Medications on File Prior to Visit  ?Medication Sig Dispense Refill  ? baclofen (LIORESAL) 10 MG tablet Take 1 tablet (10 mg total) by mouth 3 (three) times daily. 30 each 2  ? cyclobenzaprine (FLEXERIL) 10 MG tablet TAKE ONE TABLET BY MOUTH 3 TIMES DAILY AS NEEDED FOR MUSCLE SPASMS 30 tablet 0  ? ibuprofen (ADVIL) 800 MG tablet Take 1 tablet (800 mg total) by mouth every 8 (eight) hours as needed. 90 tablet 0  ? omeprazole (PRILOSEC) 40 MG capsule TAKE 1 CAPSULE BY MOUTH EVERY MORNING 90  capsule 2  ? promethazine-dextromethorphan (PROMETHAZINE-DM) 6.25-15 MG/5ML syrup Take 5 mLs by mouth 4 (four) times daily as needed. 118 mL 0  ? rosuvastatin (CRESTOR) 20 MG tablet TAKE ONE TABLET BY MOUTH EVERY DAY AS DIRECTED 90 tablet 2  ? Semaglutide, 1 MG/DOSE, (OZEMPIC, 1 MG/DOSE,) 4 MG/3ML SOPN Inject 1 mg into the skin once a week. 15 mL 3  ? zolpidem (AMBIEN) 10 MG tablet TAKE ONE TABLET BY MOUTH AT BEDTIME AS NEEDED 30 tablet 3  ? ?No current facility-administered medications on file prior to visit.  ? ?Past Medical History:  ?Diagnosis Date  ? Adjustment insomnia   ? Diabetes mellitus without complication ([place])   ? GERD (gastroesophageal reflux disease)   ? Hypertension   ? Impaired fasting glucose   ? Mixed hyperlipidemia   ? Other vitamin B12 deficiency anemias   ? Plantar fascial fibromatosis   ? Right arm fracture   ? ?Past Surgical History:  ?Procedure Laterality Date  ? ABDOMINAL HYSTERECTOMY    ? bladder tacking     ? CHOLECYSTECTOMY    ? crevical ablation     ? CYSTOSCOPY N/A 02/16/2016  ? Procedure: CYSTOSCOPY;  Surgeon: Bjorn Loser, MD;  Location: [facility];  Service: Urology;  Laterality: N/A;  ? REVISION URINARY SLING N/A 02/16/2016  ? Procedure: REMOVAL URINARY SLING;  Surgeon: Bjorn Loser, MD;  Location: [facility];  Service: Urology;  Laterality: N/A;  ?  ?Family History  ?Problem Relation Age of Onset  ? COPD Mother   ? Asthma Mother   ? Heart disease Father   ? Hyperlipidemia Father   ? Diabetes Father   ? Colon cancer Neg Hx   ? Pancreatic cancer Neg Hx   ? Stomach cancer Neg Hx   ? Liver disease Neg Hx   ? ?Social History  ? ?Socioeconomic History  ? Marital status: Married  ?  Spouse name: Not on file  ? Number of children: 3  ? Years of education: Not on file  ? Highest education level: Not on file  ?Occupational History  ? Occupation: Engineer, building services  ?Tobacco Use  ? Smoking status: Former  ?  Packs/day: 0.25  ?  Years: 5.00  ?  Pack years: 1.25  ?  Types: Cigarettes  ?  Quit date:  2020  ?  Years since quitting: 3.2  ? Smokeless tobacco: Never  ?Vaping Use  ? Vaping Use: Every day  ? Substances: Nicotine, Flavoring  ?Substance and Sexual Activity  ? Alcohol use: Yes  ?  Alcohol/week: 2.0 standard drinks  ?  Types: 2 Glasses of wine per week  ?  Comment: occasionally  ? Drug use: No  ? Sexual activity: Yes  ?  Partners: Male  ?Other Topics Concern  ? Not on file  ?Social  History Narrative  ? Not on file  ? ?Social Determinants of Health  ? ?Financial Resource Strain: Not on file  ?Food Insecurity: Not on file  ?Transportation Needs: Not on file  ?Physical Activity: Not on file  ?Stress: Not on file  ?Social Connections: Not on file  ? ? ?Review of Systems  ?Constitutional:  Positive for fatigue. Negative for appetite change, chills and fever.  ?HENT:  Negative for congestion, ear discharge, ear pain, rhinorrhea, sinus pressure, sneezing and sore throat.   ?Eyes:  Negative for visual disturbance.  ?Respiratory:  Negative for cough, chest tightness, shortness of breath and wheezing.   ?Cardiovascular:  Negative for chest pain, palpitations and leg swelling.  ?Gastrointestinal:  Negative for abdominal pain, diarrhea, nausea and vomiting.  ?Endocrine: Negative for polydipsia, polyphagia and polyuria.  ?Genitourinary:  Negative for difficulty urinating, dysuria, frequency, hematuria, menstrual problem, urgency, vaginal bleeding, vaginal discharge and vaginal pain.  ?Musculoskeletal:  Negative for back pain, gait problem, joint swelling, myalgias and neck pain.  ?Neurological:  Negative for dizziness, seizures, syncope, weakness, numbness and headaches.  ?Psychiatric/Behavioral:  Positive for sleep disturbance (insomnia). Negative for agitation, confusion, hallucinations and suicidal ideas. The patient is not nervous/anxious.   ? ? ?Objective:  ?BP 110/82   Pulse 90   Temp (!) 97.5 ?F (36.4 ?C)   Ht 5' 3"  (1.6 m)   Wt 201 lb 6.4 oz (91.4 kg)   SpO2 98%   BMI 35.68 kg/m?  ? ? ? ?  07/09/2021  ?   8:36 AM 05/19/2021  ?  8:05 AM 05/18/2021  ?  3:31 PM  ?BP/Weight  ?Systolic BP 938 101 751  ?Diastolic BP 80 64 88  ?Wt. (Lbs) 206 207   ?BMI 36.49 kg/m2 36.67 kg/m2   ? ? ?Physical Exam ?Vitals reviewed.  ?Const

## 2021-09-18 LAB — COMPREHENSIVE METABOLIC PANEL
ALT: 34 IU/L — ABNORMAL HIGH (ref 0–32)
AST: 29 IU/L (ref 0–40)
Albumin/Globulin Ratio: 2.1 (ref 1.2–2.2)
Albumin: 4.8 g/dL (ref 3.8–4.9)
Alkaline Phosphatase: 83 IU/L (ref 44–121)
BUN/Creatinine Ratio: 16 (ref 9–23)
BUN: 12 mg/dL (ref 6–24)
Bilirubin Total: 0.6 mg/dL (ref 0.0–1.2)
CO2: 23 mmol/L (ref 20–29)
Calcium: 9.6 mg/dL (ref 8.7–10.2)
Chloride: 106 mmol/L (ref 96–106)
Creatinine, Ser: 0.75 mg/dL (ref 0.57–1.00)
Globulin, Total: 2.3 g/dL (ref 1.5–4.5)
Glucose: 95 mg/dL (ref 70–99)
Potassium: 4.3 mmol/L (ref 3.5–5.2)
Sodium: 143 mmol/L (ref 134–144)
Total Protein: 7.1 g/dL (ref 6.0–8.5)
eGFR: 96 mL/min/{1.73_m2} (ref >59)

## 2021-09-18 LAB — HEMOGLOBIN A1C
Est. average glucose Bld gHb Est-mCnc: 82 mg/dL
Hgb A1c MFr Bld: 4.5 % — ABNORMAL LOW (ref 4.8–5.6)

## 2021-09-18 LAB — LIPID PANEL
Chol/HDL Ratio: 3.5 ratio (ref 0.0–4.4)
Cholesterol, Total: 144 mg/dL (ref 100–199)
HDL: 41 mg/dL (ref >39)
LDL Chol Calc (NIH): 64 mg/dL (ref 0–99)
Triglycerides: 245 mg/dL — ABNORMAL HIGH (ref 0–149)
VLDL Cholesterol Cal: 39 mg/dL (ref 5–40)

## 2021-09-18 LAB — CBC WITH DIFFERENTIAL/PLATELET
Basophils Absolute: 0 10*3/uL (ref 0.0–0.2)
Basos: 0 %
EOS (ABSOLUTE): 0.1 10*3/uL (ref 0.0–0.4)
Eos: 1 %
Hematocrit: 36.5 % (ref 34.0–46.6)
Hemoglobin: 12.2 g/dL (ref 11.1–15.9)
Immature Grans (Abs): 0 10*3/uL (ref 0.0–0.1)
Immature Granulocytes: 0 %
Lymphocytes Absolute: 1.5 10*3/uL (ref 0.7–3.1)
Lymphs: 23 %
MCH: 30.6 pg (ref 26.6–33.0)
MCHC: 33.4 g/dL (ref 31.5–35.7)
MCV: 92 fL (ref 79–97)
Monocytes Absolute: 0.6 10*3/uL (ref 0.1–0.9)
Monocytes: 8 %
Neutrophils Absolute: 4.5 10*3/uL (ref 1.4–7.0)
Neutrophils: 68 %
Platelets: 270 10*3/uL (ref 150–450)
RBC: 3.99 x10E6/uL (ref 3.77–5.28)
RDW: 13.9 % (ref 11.7–15.4)
WBC: 6.7 10*3/uL (ref 3.4–10.8)

## 2021-09-18 LAB — CARDIOVASCULAR RISK ASSESSMENT

## 2021-09-20 ENCOUNTER — Encounter: Payer: Self-pay | Admitting: Nurse Practitioner

## 2021-09-21 ENCOUNTER — Other Ambulatory Visit: Payer: Self-pay

## 2021-09-21 MED ORDER — OMEGA-3-ACID ETHYL ESTERS 1 G PO CAPS: 2.0000 g | ORAL_CAPSULE | Freq: Two times a day (BID) | ORAL | 0 refills | Status: DC

## 2021-10-06 ENCOUNTER — Encounter: Payer: Self-pay | Admitting: Plastic Surgery

## 2021-10-06 ENCOUNTER — Ambulatory Visit (INDEPENDENT_AMBULATORY_CARE_PROVIDER_SITE_OTHER): Payer: Self-pay | Admitting: Plastic Surgery

## 2021-10-06 VITALS — BP 112/81 | HR 97 | Ht 63.0 in | Wt 200.2 lb

## 2021-10-06 DIAGNOSIS — Z411 Encounter for cosmetic surgery: Secondary | ICD-10-CM

## 2021-10-06 NOTE — Progress Notes (Signed)
? ?Referring Provider ?Lillard Anes, MD ?[address] ?[address] ?[place],  [place] [address]  ? ?CC:  ?Chief Complaint  ?Patient presents with  ? Consult  ?   ?  ?   ? ?Madison Carey is an 51 y.o. female.  ?HPI: Patient presents to discuss a breast lift.  She would like to be the same size approximately but with a lifted more youthful appearance of the breast.  She does not have back pain and no skin rashes beneath the breast.  She is up-to-date on her mammograms and they have been normal.  No family history of breast cancer no previous breast biopsies or procedures or self.  She does vape with nicotine products but feels as though she could easily stop.  She is not a diabetic. ? ?Allergies  ?Allergen Reactions  ? Codeine Itching  ? Sulfa Antibiotics Other (See Comments)  ? ? ?Outpatient Encounter Medications as of 10/06/2021  ?Medication Sig  ? baclofen (LIORESAL) 10 MG tablet Take 1 tablet (10 mg total) by mouth 3 (three) times daily.  ? cyclobenzaprine (FLEXERIL) 10 MG tablet TAKE ONE TABLET BY MOUTH 3 TIMES DAILY AS NEEDED FOR MUSCLE SPASMS  ? ibuprofen (ADVIL) 800 MG tablet Take 1 tablet (800 mg total) by mouth every 8 (eight) hours as needed.  ? omega-3 acid ethyl esters (LOVAZA) 1 g capsule Take 2 capsules (2 g total) by mouth 2 (two) times daily.  ? omeprazole (PRILOSEC) 40 MG capsule TAKE 1 CAPSULE BY MOUTH EVERY MORNING  ? rosuvastatin (CRESTOR) 20 MG tablet TAKE ONE TABLET BY MOUTH EVERY DAY AS DIRECTED  ? Semaglutide, 2 MG/DOSE, 8 MG/3ML SOPN Inject 2 mg as directed once a week.  ? zolpidem (AMBIEN) 10 MG tablet Take 10 mg by mouth at bedtime as needed for sleep.  ? [DISCONTINUED] Suvorexant (BELSOMRA) 10 MG TABS Take 10 mg by mouth at bedtime as needed.  ? [DISCONTINUED] Suvorexant (BELSOMRA) 10 MG TABS Take 10 mg by mouth at bedtime as needed.  ? ?No facility-administered encounter medications on file as of 10/06/2021.  ?  ? ?Past Medical History:  ?Diagnosis Date  ? Adjustment insomnia   ?  Diabetes mellitus without complication ([place])   ? GERD (gastroesophageal reflux disease)   ? Hypertension   ? Impaired fasting glucose   ? Mixed hyperlipidemia   ? Other vitamin B12 deficiency anemias   ? Plantar fascial fibromatosis   ? Right arm fracture   ? ? ?Past Surgical History:  ?Procedure Laterality Date  ? ABDOMINAL HYSTERECTOMY    ? bladder tacking     ? CHOLECYSTECTOMY    ? crevical ablation     ? CYSTOSCOPY N/A 02/16/2016  ? Procedure: CYSTOSCOPY;  Surgeon: Bjorn Loser, MD;  Location: [facility];  Service: Urology;  Laterality: N/A;  ? REVISION URINARY SLING N/A 02/16/2016  ? Procedure: REMOVAL URINARY SLING;  Surgeon: Bjorn Loser, MD;  Location: [facility];  Service: Urology;  Laterality: N/A;  ? ? ?Family History  ?Problem Relation Age of Onset  ? COPD Mother   ? Asthma Mother   ? Heart disease Father   ? Hyperlipidemia Father   ? Diabetes Father   ? Colon cancer Neg Hx   ? Pancreatic cancer Neg Hx   ? Stomach cancer Neg Hx   ? Liver disease Neg Hx   ? ? ?Social History  ? ?Social History Narrative  ? Not on file  ?  ? ?Review of Systems ?General: Denies  fevers, chills, weight loss ?CV: Denies chest pain, shortness of breath, palpitations ? ?Physical Exam ? ?  10/06/2021  ? 10:10 AM 09/17/2021  ?  9:22 AM 07/09/2021  ?  8:36 AM  ?Vitals with BMI  ?Height '5\' 3"'$  '5\' 3"'$  '5\' 3"'$   ?Weight 200 lbs 3 oz 201 lbs 6 oz 206 lbs  ?BMI 35.47 35.69 36.5  ?Systolic 670 141 030  ?Diastolic 81 82 80  ?Pulse 97 90 80  ?  ?General:  No acute distress,  Alert and oriented, Non-Toxic, Normal speech and affect ?Breast: She has grade 3 ptosis.  Sternal notch to nipple is 29 cm bilaterally.  Nipple to fold is 11 cm bilaterally.  The right side looks slightly larger.  No obvious scars or masses. ? ?Assessment/Plan ?I long discussion with the patient about her options.  Ultimately I do think she is a good candidate for Wise pattern mastopexy.  We also discussed the option of fat grafting to the breast and she would be interested in  thinking about this.  I explained to the difference would be the ability to add a bit more upper pole volume with the fat grafting.  We discussed the location and orientation of the scars.  We discussed risks include bleeding, infection, damage to surrounding structures need for additional procedures.  We discussed the anticipated recovery time.  All of her questions were answered and we will plan to provide a quote for her. ? ?Cindra Presume ?10/06/2021, 11:32 AM  ? ? ?  ?

## 2021-10-07 ENCOUNTER — Other Ambulatory Visit: Payer: Self-pay | Admitting: Legal Medicine

## 2021-10-28 ENCOUNTER — Telehealth: Payer: Self-pay

## 2021-10-28 NOTE — Telephone Encounter (Signed)
Called patient to follow up on quote. LMVM if she has any questions, or ready to move forward and schedule surgery.

## 2021-11-04 ENCOUNTER — Other Ambulatory Visit: Payer: Self-pay

## 2021-11-04 DIAGNOSIS — M542 Cervicalgia: Secondary | ICD-10-CM

## 2021-11-04 MED ORDER — IBUPROFEN 800 MG PO TABS: 800.0000 mg | ORAL_TABLET | Freq: Three times a day (TID) | ORAL | 0 refills | Status: DC | PRN

## 2021-12-10 ENCOUNTER — Other Ambulatory Visit: Payer: Self-pay | Admitting: Nurse Practitioner

## 2021-12-20 ENCOUNTER — Encounter: Payer: Self-pay | Admitting: Nurse Practitioner

## 2021-12-20 ENCOUNTER — Ambulatory Visit: Payer: 59 | Admitting: Nurse Practitioner

## 2021-12-20 VITALS — BP 116/60 | HR 98 | Temp 97.0°F | Ht 63.0 in | Wt 192.0 lb

## 2021-12-20 DIAGNOSIS — K219 Gastro-esophageal reflux disease without esophagitis: Secondary | ICD-10-CM

## 2021-12-20 DIAGNOSIS — Z90710 Acquired absence of both cervix and uterus: Secondary | ICD-10-CM

## 2021-12-20 DIAGNOSIS — E782 Mixed hyperlipidemia: Secondary | ICD-10-CM | POA: Diagnosis not present

## 2021-12-20 DIAGNOSIS — E8881 Metabolic syndrome: Secondary | ICD-10-CM | POA: Diagnosis not present

## 2021-12-20 DIAGNOSIS — E6609 Other obesity due to excess calories: Secondary | ICD-10-CM

## 2021-12-20 DIAGNOSIS — Z6834 Body mass index (BMI) 34.0-34.9, adult: Secondary | ICD-10-CM

## 2021-12-20 DIAGNOSIS — M542 Cervicalgia: Secondary | ICD-10-CM

## 2021-12-20 DIAGNOSIS — Z2821 Immunization not carried out because of patient refusal: Secondary | ICD-10-CM

## 2021-12-20 MED ORDER — ROSUVASTATIN CALCIUM 20 MG PO TABS: 20.0000 mg | ORAL_TABLET | Freq: Every day | ORAL | 2 refills | Status: DC

## 2021-12-20 MED ORDER — CYCLOBENZAPRINE HCL 10 MG PO TABS: ORAL_TABLET | ORAL | 1 refills | Status: DC

## 2021-12-20 NOTE — Patient Instructions (Signed)
Continue medications We will call you with labs today Follow-up in 48-month, fasting  Insulin Resistance  Insulin is a hormone that is made by the pancreas. Insulin allows blood sugar (glucose) to enter the cells in the body. Insulin helps the body use glucose for energy. Normally, the body is insulin sensitive, which means the cells in the body are effective at absorbing glucose. Insulin resistance is when the cells in the body do not respond properly to insulin and are not able to absorb glucose. The pancreas makes more insulin, but over time the body cannot make enough insulin to keep glucose at normal levels. Insulin resistance results in high blood glucose levels (hyperglycemia) and can lead to problems, including: Prediabetes. Type 2 diabetes (diabetes mellitus). Heart disease. High blood pressure (hypertension). Stroke. Polycystic ovary syndrome (PCOS). Nonalcoholic fatty liver disease. What are the causes? The exact cause of insulin resistance is not known. What increases the risk? The following factors may make you more likely to develop insulin resistance: Being overweight or obese, especially if a lot of your weight is in your waist area. Having an inactive (sedentary) lifestyle. Having above-normal glucose levels. Having abnormal cholesterol levels. Having sleep apnea. Being older than age 52 Using steroids. What are the signs or symptoms? This condition usually does not cause symptoms. A waist measurement of more than 35 inches (88.9 cm) for women and more than 40 inches (101.6 cm) for men may be a sign of insulin resistance. How is this diagnosed? There is no test to diagnose insulin resistance. However, your health care provider may diagnose insulin resistance based on: A physical exam. Your medical history. Blood tests that check your blood glucose level. How is this treated? Insulin resistance is treated with nutrition and lifestyle changes. These changes may  include: Eating a healthy balance of nutritious foods. Getting more physical activity. Maintaining a healthy weight. Stopping the use of any tobacco products. Your health care provider will work with you to change your nutrition and lifestyle as needed. In some cases, treatment may also include medicine to improve your insulin sensitivity. Follow these instructions at home: Activity Be physically active. Do moderate-intensity physical activity for at least 30 minutes on 5 or more days of the week, or as told by your health care provider. This could include brisk walking, biking, or water aerobics. Ask your health care provider what activities are safe for you. A mix of physical activities may be best, such as walking, swimming, biking, and strength training. Eating and drinking  Follow a healthy meal plan. This includes eating: Lean proteins. Complex carbohydrates. Examples of these include whole grains, starchy vegetables (potatoes, corn, peas), and beans. Fresh fruits and vegetables. Low-fat dairy products. Healthy fats. Follow instructions from your health care provider about eating or drinking restrictions. Make an appointment to see a diet and nutrition specialist (registered dietitian) to help you create a healthy eating plan. General instructions Check your blood glucose levels as told by your health care provider. Take over-the-counter and prescription medicines only as told by your health care provider. Lose weight as told by your health care provider. Losing 5-7% of your body weight can reverse insulin resistance. Your health care provider can determine how much weight loss is best for you and can help you lose weight safely. Do not use any products that contain nicotine or tobacco. These products include cigarettes, chewing tobacco, and vaping devices, such as e-cigarettes. If you need help quitting, ask your health care provider. Keep all follow-up  visits. This is  important. Contact a health care provider if: You have trouble losing weight or maintaining your goal weight. You gain weight. You have trouble following your prescribed meal plan. You have trouble exercising more. Summary Insulin resistance occurs when cells in the body do not respond properly to insulin and are not able to absorb blood sugar (glucose). The body makes more insulin, but over time the body cannot make enough insulin to keep blood sugar at normal levels. Insulin resistance is treated with nutrition and lifestyle changes, including eating a healthy balance of nutritious foods, getting more physical activity, and maintaining a healthy weight. Your health care provider will work with you to change your nutrition and lifestyle as needed. Treatment may also include medicine to improve your insulin sensitivity. Check your blood glucose levels as told by your health care provider. Keep all follow-up visits. This is important. This information is not intended to replace advice given to you by your health care provider. Make sure you discuss any questions you have with your health care provider. Document Revised: 02/10/2020 Document Reviewed: 02/10/2020 Elsevier Patient Education  [place].

## 2021-12-20 NOTE — Progress Notes (Signed)
Subjective:  Patient ID: Madison Carey, female    DOB: Oct 04, 1970  Age: 51 y.o. MRN: [id number]  Chief Complaint  Patient presents with   Hyperlipidemia   Gastroesophageal Reflux   Madison Carey is a 51 year old Caucasian female that presents for follow-up of insulin resistance, hyperlipidemia, and GERD. Current weight 192, BMI 34.01   Lipid/Cholesterol, Follow-up  Last lipid panel Other pertinent labs  Lab Results  Component Value Date   CHOL 144 09/17/2021   HDL 41 09/17/2021   LDLCALC 64 09/17/2021   TRIG 245 (H) 09/17/2021   CHOLHDL 3.5 09/17/2021   Lab Results  Component Value Date   ALT 34 (H) 09/17/2021   AST 29 09/17/2021   PLT 270 09/17/2021   TSH 0.856 08/30/2019     She was last seen for this 4 months ago.  Management includes crestor.  She reports excellent compliance with treatment. She is not having side effects.   Current diet: in general, a "healthy" diet   Current exercise: walking  GERD, Follow up:  The patient was last seen for GERD 4 months ago. Current treatment consist of: Omeprazole  She reports excellent compliance with treatment. She is not having side effects. .  Diabetes Mellitus Type II, Follow-up  Lab Results  Component Value Date   HGBA1C 4.5 (L) 09/17/2021   HGBA1C 4.6 (L) 05/19/2021   HGBA1C 4.7 (L) 01/13/2021   Wt Readings from Last 3 Encounters:  12/20/21 192 lb (87.1 kg)  10/06/21 200 lb 3.2 oz (90.8 kg)  09/17/21 201 lb 6.4 oz (91.4 kg)   Management  includes Ozempic. She reports excellent compliance with treatment. She is not having side effects.  Most Recent Eye Exam: 06/2021   Pertinent Labs: Lab Results  Component Value Date   CHOL 144 09/17/2021   HDL 41 09/17/2021   LDLCALC 64 09/17/2021   TRIG 245 (H) 09/17/2021   CHOLHDL 3.5 09/17/2021   Lab Results  Component Value Date   NA 143 09/17/2021   K 4.3 09/17/2021   CREATININE 0.75 09/17/2021   EGFR 96 09/17/2021   GFRNONAA 94 07/16/2020   GLUCOSE 95  09/17/2021        Current Outpatient Medications on File Prior to Visit  Medication Sig Dispense Refill   baclofen (LIORESAL) 10 MG tablet Take 1 tablet (10 mg total) by mouth 3 (three) times daily. 30 each 2   cyclobenzaprine (FLEXERIL) 10 MG tablet TAKE ONE TABLET BY MOUTH 3 TIMES DAILY AS NEEDED FOR MUSCLE SPASMS 90 tablet 1   ibuprofen (ADVIL) 800 MG tablet Take 1 tablet (800 mg total) by mouth every 8 (eight) hours as needed. 90 tablet 0   omega-3 acid ethyl esters (LOVAZA) 1 g capsule Take 2 capsules (2 g total) by mouth 2 (two) times daily. 360 capsule 0   omeprazole (PRILOSEC) 40 MG capsule TAKE 1 CAPSULE BY MOUTH EVERY MORNING 90 capsule 2   rosuvastatin (CRESTOR) 20 MG tablet TAKE ONE TABLET BY MOUTH EVERY DAY AS DIRECTED 90 tablet 2   Semaglutide, 2 MG/DOSE, 8 MG/3ML SOPN Inject 2 mg as directed once a week. 3 mL 3   zolpidem (AMBIEN) 10 MG tablet TAKE ONE TABLET BY MOUTH AT BEDTIME AS NEEDED 30 tablet 0   No current facility-administered medications on file prior to visit.   Past Medical History:  Diagnosis Date   Adjustment insomnia    Diabetes mellitus without complication (HCC)    GERD (gastroesophageal reflux disease)    Hypertension  Impaired fasting glucose    Mixed hyperlipidemia    Other vitamin B12 deficiency anemias    Plantar fascial fibromatosis    Right arm fracture    Past Surgical History:  Procedure Laterality Date   ABDOMINAL HYSTERECTOMY     bladder tacking      CHOLECYSTECTOMY     crevical ablation      CYSTOSCOPY N/A 02/16/2016   Procedure: CYSTOSCOPY;  Surgeon: Bjorn Loser, MD;  Location: [facility];  Service: Urology;  Laterality: N/A;   REVISION URINARY SLING N/A 02/16/2016   Procedure: REMOVAL URINARY SLING;  Surgeon: Bjorn Loser, MD;  Location: [facility];  Service: Urology;  Laterality: N/A;    Family History  Problem Relation Age of Onset   COPD Mother    Asthma Mother    Heart disease Father    Hyperlipidemia Father    Diabetes  Father    Colon cancer Neg Hx    Pancreatic cancer Neg Hx    Stomach cancer Neg Hx    Liver disease Neg Hx    Social History   Socioeconomic History   Marital status: Married    Spouse name: Not on file   Number of children: 3   Years of education: Not on file   Highest education level: Not on file  Occupational History   Occupation: Engineer, building services  Tobacco Use   Smoking status: Former    Packs/day: 0.25    Years: 5.00    Total pack years: 1.25    Types: Cigarettes    Quit date: 2020    Years since quitting: 3.5   Smokeless tobacco: Never  Vaping Use   Vaping Use: Every day   Substances: Nicotine, Flavoring  Substance and Sexual Activity   Alcohol use: Yes    Alcohol/week: 2.0 standard drinks of alcohol    Types: 2 Glasses of wine per week    Comment: occasionally   Drug use: No   Sexual activity: Yes    Partners: Male  Other Topics Concern   Not on file  Social History Narrative   Not on file   Social Determinants of Health   Financial Resource Strain: Low Risk  (12/20/2021)   Overall Financial Resource Strain (CARDIA)    Difficulty of Paying Living Expenses: Not hard at all  Food Insecurity: No Food Insecurity (12/20/2021)   Hunger Vital Sign    Worried About Running Out of Food in the Last Year: Never true    [place] in the Last Year: Never true  Transportation Needs: No Transportation Needs (12/20/2021)   PRAPARE - Hydrologist (Medical): No    Lack of Transportation (Non-Medical): No  Physical Activity: Sufficiently Active (12/20/2021)   Exercise Vital Sign    Days of Exercise per Week: 5 days    Minutes of Exercise per Session: 60 min  Stress: No Stress Concern Present (12/20/2021)   [place]    Feeling of Stress : Not at all  Social Connections: Moderately Integrated (12/20/2021)   Social Connection and Isolation Panel [NHANES]    Frequency of  Communication with Friends and Family: More than three times a week    Frequency of Social Gatherings with Friends and Family: More than three times a week    Attends Religious Services: More than 4 times per year    Active Member of [employer] or Organizations: Not on file    Attends Club or  Organization Meetings: Never    Marital Status: Married    Review of Systems  Constitutional:  Negative for chills, fatigue and fever.  HENT:  Negative for congestion, ear pain, rhinorrhea and sore throat.   Respiratory:  Negative for cough and shortness of breath.   Cardiovascular:  Negative for chest pain.  Gastrointestinal:  Negative for abdominal pain, constipation, diarrhea, nausea and vomiting.  Genitourinary:  Negative for dysuria and urgency.  Musculoskeletal:  Negative for back pain and myalgias.  Neurological:  Negative for dizziness, weakness, light-headedness and headaches.  Psychiatric/Behavioral:  Negative for dysphoric mood. The patient is not nervous/anxious.      Objective:  BP 116/60   Pulse 98   Temp (!) 97 F (36.1 C)   Ht 5' 3"  (1.6 m)   Wt 192 lb (87.1 kg)   SpO2 98%   BMI 34.01 kg/m       12/20/2021    7:41 AM 10/06/2021   10:10 AM 09/17/2021    9:22 AM  BP/Weight  Systolic BP  161 096  Diastolic BP  81 82  Wt. (Lbs) 192 200.2 201.4  BMI 34.01 kg/m2 35.46 kg/m2 35.68 kg/m2    Physical Exam Vitals reviewed.  Constitutional:      Appearance: Normal appearance. She is normal weight.  HENT:     Right Ear: Tympanic membrane, ear canal and external ear normal.     Left Ear: Tympanic membrane and external ear normal.     Nose: Nose normal.     Mouth/Throat:     Pharynx: Oropharynx is clear.  Cardiovascular:     Rate and Rhythm: Normal rate and regular rhythm.     Pulses: Normal pulses.     Heart sounds: Normal heart sounds. No murmur heard. Pulmonary:     Effort: Pulmonary effort is normal. No respiratory distress.     Breath sounds: Normal breath sounds.   Abdominal:     General: Abdomen is flat. Bowel sounds are normal.     Palpations: Abdomen is soft.     Tenderness: There is no abdominal tenderness.  Neurological:     Mental Status: She is alert and oriented to person, place, and time.  Psychiatric:        Mood and Affect: Mood normal.        Behavior: Behavior normal.         Lab Results  Component Value Date   WBC 6.7 09/17/2021   HGB 12.2 09/17/2021   HCT 36.5 09/17/2021   PLT 270 09/17/2021   GLUCOSE 95 09/17/2021   CHOL 144 09/17/2021   TRIG 245 (H) 09/17/2021   HDL 41 09/17/2021   LDLCALC 64 09/17/2021   ALT 34 (H) 09/17/2021   AST 29 09/17/2021   NA 143 09/17/2021   K 4.3 09/17/2021   CL 106 09/17/2021   CREATININE 0.75 09/17/2021   BUN 12 09/17/2021   CO2 23 09/17/2021   TSH 0.856 08/30/2019   HGBA1C 4.5 (L) 09/17/2021   MICROALBUR 80 01/13/2021      Assessment & Plan:   1. Mixed hyperlipidemia-not at goal - rosuvastatin (CRESTOR) 20 MG tablet; Take 1 tablet (20 mg total) by mouth daily. as directed  Dispense: 90 tablet; Refill: 2 - CBC with Differential/Platelet - Comprehensive metabolic panel - Lipid panel - TSH  2. Insulin resistance - CBC with Differential/Platelet - Comprehensive metabolic panel - Lipid panel - TSH -continue heart healthy, low carb diet -continue regular physical activity  3. Gastroesophageal reflux disease,  unspecified whether esophagitis present-well controlled - CBC with Differential/Platelet - Comprehensive metabolic panel -continue Prilosec as prescribed -avoid foods that trigger GERD  4. Absence of uterus -no pap smear required  5. Neck pain on right side - cyclobenzaprine (FLEXERIL) 10 MG tablet; TAKE ONE TABLET BY MOUTH 3 TIMES DAILY AS NEEDED FOR MUSCLE SPASMS  Dispense: 90 tablet; Refill: 1  6. Vaccination declined -pt declined Shingrix vaccine  7. Class 1 obesity due to excess calories with serious comorbidity and body mass index (BMI) of 34.0 to 34.9  in adult - CBC with Differential/Platelet - Comprehensive metabolic panel - Lipid panel - TSH   Continue medications We will call you with labs today Follow-up in 3-month, fasting     Follow-up: 344-month fasting  An After Visit Summary was printed and given to the patient.  I, ShRip HarbourNP, have reviewed all documentation for this visit. The documentation on 12/20/21 for the exam, diagnosis, procedures, and orders are all accurate and complete.   Signed, ShRip HarbourNP [id number] [phone]

## 2021-12-21 LAB — CBC WITH DIFFERENTIAL/PLATELET
Basophils Absolute: 0 10*3/uL (ref 0.0–0.2)
Basos: 1 %
EOS (ABSOLUTE): 0.1 10*3/uL (ref 0.0–0.4)
Eos: 1 %
Hematocrit: 34.9 % (ref 34.0–46.6)
Hemoglobin: 12.3 g/dL (ref 11.1–15.9)
Immature Grans (Abs): 0 10*3/uL (ref 0.0–0.1)
Immature Granulocytes: 0 %
Lymphocytes Absolute: 1.2 10*3/uL (ref 0.7–3.1)
Lymphs: 24 %
MCH: 31.9 pg (ref 26.6–33.0)
MCHC: 35.2 g/dL (ref 31.5–35.7)
MCV: 91 fL (ref 79–97)
Monocytes Absolute: 0.4 10*3/uL (ref 0.1–0.9)
Monocytes: 8 %
Neutrophils Absolute: 3.4 10*3/uL (ref 1.4–7.0)
Neutrophils: 66 %
Platelets: 233 10*3/uL (ref 150–450)
RBC: 3.85 x10E6/uL (ref 3.77–5.28)
RDW: 13.6 % (ref 11.7–15.4)
WBC: 5.1 10*3/uL (ref 3.4–10.8)

## 2021-12-21 LAB — LIPID PANEL
Chol/HDL Ratio: 4 ratio (ref 0.0–4.4)
Cholesterol, Total: 133 mg/dL (ref 100–199)
HDL: 33 mg/dL — ABNORMAL LOW (ref >39)
LDL Chol Calc (NIH): 55 mg/dL (ref 0–99)
Triglycerides: 286 mg/dL — ABNORMAL HIGH (ref 0–149)
VLDL Cholesterol Cal: 45 mg/dL — ABNORMAL HIGH (ref 5–40)

## 2021-12-21 LAB — COMPREHENSIVE METABOLIC PANEL
ALT: 40 IU/L — ABNORMAL HIGH (ref 0–32)
AST: 29 IU/L (ref 0–40)
Albumin/Globulin Ratio: 2 (ref 1.2–2.2)
Albumin: 4.6 g/dL (ref 3.8–4.9)
Alkaline Phosphatase: 73 IU/L (ref 44–121)
BUN/Creatinine Ratio: 12 (ref 9–23)
BUN: 9 mg/dL (ref 6–24)
Bilirubin Total: 0.6 mg/dL (ref 0.0–1.2)
CO2: 22 mmol/L (ref 20–29)
Calcium: 9.5 mg/dL (ref 8.7–10.2)
Chloride: 106 mmol/L (ref 96–106)
Creatinine, Ser: 0.75 mg/dL (ref 0.57–1.00)
Globulin, Total: 2.3 g/dL (ref 1.5–4.5)
Glucose: 91 mg/dL (ref 70–99)
Potassium: 3.9 mmol/L (ref 3.5–5.2)
Sodium: 142 mmol/L (ref 134–144)
Total Protein: 6.9 g/dL (ref 6.0–8.5)
eGFR: 96 mL/min/{1.73_m2} (ref >59)

## 2021-12-21 LAB — CARDIOVASCULAR RISK ASSESSMENT

## 2021-12-21 LAB — TSH: TSH: 1.01 u[IU]/mL (ref 0.450–4.500)

## 2021-12-30 ENCOUNTER — Other Ambulatory Visit: Payer: Self-pay | Admitting: Nurse Practitioner

## 2022-01-04 ENCOUNTER — Other Ambulatory Visit: Payer: Self-pay

## 2022-01-05 ENCOUNTER — Other Ambulatory Visit: Payer: Self-pay | Admitting: Nurse Practitioner

## 2022-01-05 ENCOUNTER — Telehealth: Payer: Self-pay

## 2022-01-05 NOTE — Telephone Encounter (Signed)
Received secure chat from [facility] as a refill request was sent this morning for patient's ambien. This was denied due to being too early.   148pm: Made patient aware we could not fill this as it was early. She states she only received 22 pills and she is out of medication. She leaves Friday to go out of town for 8 days. She is very concerned if she has to go without medication.   150pm: Spoke to [place] with [facility], he states on 7/10 patient received 22 of 30.   Please advise.   Royce Macadamia, [place] 01/05/22 2:02 PM

## 2022-01-05 NOTE — Telephone Encounter (Signed)
Patient is requesting refill as she will be traveling out of town.  Royce Macadamia, [place] 01/05/22 11:36 AM

## 2022-01-06 NOTE — Telephone Encounter (Signed)
Patient aware.  Madison Carey, [place] 01/06/22 11:06 AM

## 2022-01-17 ENCOUNTER — Other Ambulatory Visit: Payer: Self-pay | Admitting: Nurse Practitioner

## 2022-01-17 DIAGNOSIS — Z6835 Body mass index (BMI) 35.0-35.9, adult: Secondary | ICD-10-CM

## 2022-01-17 DIAGNOSIS — Z833 Family history of diabetes mellitus: Secondary | ICD-10-CM

## 2022-01-17 DIAGNOSIS — E8881 Metabolic syndrome: Secondary | ICD-10-CM

## 2022-01-17 DIAGNOSIS — E1169 Type 2 diabetes mellitus with other specified complication: Secondary | ICD-10-CM

## 2022-01-26 ENCOUNTER — Other Ambulatory Visit: Payer: Self-pay

## 2022-01-26 MED ORDER — OMEGA-3-ACID ETHYL ESTERS 1 G PO CAPS: 2.0000 g | ORAL_CAPSULE | Freq: Two times a day (BID) | ORAL | 0 refills | Status: DC

## 2022-02-15 ENCOUNTER — Other Ambulatory Visit: Payer: Self-pay | Admitting: Nurse Practitioner

## 2022-02-16 ENCOUNTER — Other Ambulatory Visit: Payer: Self-pay

## 2022-02-16 DIAGNOSIS — M542 Cervicalgia: Secondary | ICD-10-CM

## 2022-02-16 DIAGNOSIS — K219 Gastro-esophageal reflux disease without esophagitis: Secondary | ICD-10-CM

## 2022-02-16 MED ORDER — CYCLOBENZAPRINE HCL 10 MG PO TABS: ORAL_TABLET | ORAL | 1 refills | Status: DC

## 2022-02-16 MED ORDER — OMEPRAZOLE 40 MG PO CPDR: 40.0000 mg | DELAYED_RELEASE_CAPSULE | Freq: Every morning | ORAL | 2 refills | Status: DC

## 2022-03-14 LAB — HM MAMMOGRAPHY

## 2022-03-15 ENCOUNTER — Other Ambulatory Visit: Payer: Self-pay | Admitting: Nurse Practitioner

## 2022-03-16 ENCOUNTER — Encounter: Payer: Self-pay | Admitting: Nurse Practitioner

## 2022-03-27 NOTE — Progress Notes (Unsigned)
Established Patient Office Visit  Subjective   Patient ID: Madison Carey, female    DOB: Apr 06, 1971  Age: 51 y.o. MRN: [id number]  CC: Type 2 DM with fatty liver Hyperlipidemia GERD   HPI Pt presents for follow-up of chronic medical problems of type 2 DM with fatty liver, hyperlipidemia, GERD.  Diabetes Mellitus Type II, Follow-up  Lab Results  Component Value Date   HGBA1C 4.5 (L) 09/17/2021   HGBA1C 4.6 (L) 05/19/2021   HGBA1C 4.7 (L) 01/13/2021   Wt Readings from Last 3 Encounters:  12/20/21 192 lb (87.1 kg)  10/06/21 200 lb 3.2 oz (90.8 kg)  09/17/21 201 lb 6.4 oz (91.4 kg)   Last seen for diabetes 3 months ago.  Management  includes Ozempic 2 mg weekly . She reports good compliance with treatment. She is not having side effects.  Diabetic eye exam 06/2021 Home blood sugar records:  not being checked Episodes of hypoglycemia? No  Current insulin regiment: None Current exercise: walking Current diet habits: in general, a "healthy" diet    Lipid/Cholesterol, Follow-up  Last lipid panel Other pertinent labs  Lab Results  Component Value Date   CHOL 133 12/20/2021   HDL 33 (L) 12/20/2021   LDLCALC 55 12/20/2021   TRIG 286 (H) 12/20/2021   CHOLHDL 4.0 12/20/2021   Lab Results  Component Value Date   ALT 40 (H) 12/20/2021   AST 29 12/20/2021   PLT 233 12/20/2021   TSH 1.010 12/20/2021     She was last seen for this 3 months ago.  Management includes Crestor 20 mg QD and Lovaza 2 gm BID She reports good compliance with treatment. She is not having side effects.  Current diet: in general, a "healthy" diet   Current exercise: walking The 10-year ASCVD risk score (Arnett DK, et al., 2019) is: 2.6%   GERD, Follow up:  The patient was last seen for GERD 3 months ago. Current treatment consist of:  She reports good compliance with treatment. She is not having side effects. .  She IS experiencing heartburn. She is NOT experiencing hoarseness,  midespigastric pain, or shortness of breath   Pertinent Labs: Lab Results  Component Value Date   CHOL 133 12/20/2021   HDL 33 (L) 12/20/2021   LDLCALC 55 12/20/2021   TRIG 286 (H) 12/20/2021   CHOLHDL 4.0 12/20/2021   Lab Results  Component Value Date   NA 142 12/20/2021   K 3.9 12/20/2021   CREATININE 0.75 12/20/2021   EGFR 96 12/20/2021   GFRNONAA 94 07/16/2020   GLUCOSE 91 12/20/2021       ROS    Objective:        Physical Exam Vitals reviewed.  Constitutional:      Appearance: She is obese.  HENT:     Right Ear: Tympanic membrane normal.     Left Ear: Tympanic membrane normal.     Nose: Nose normal.     Mouth/Throat:     Mouth: Mucous membranes are moist.  Cardiovascular:     Rate and Rhythm: Normal rate and regular rhythm.     Pulses: Normal pulses.     Heart sounds: Normal heart sounds.  Pulmonary:     Effort: Pulmonary effort is normal.     Breath sounds: Normal breath sounds.  Abdominal:     General: Bowel sounds are normal.     Palpations: Abdomen is soft.  Skin:    General: Skin is warm and dry.  Capillary Refill: Capillary refill takes less than 2 seconds.  Neurological:     General: No focal deficit present.     Mental Status: She is alert and oriented to person, place, and time.  Psychiatric:        Mood and Affect: Mood normal.        Behavior: Behavior normal.     The 10-year ASCVD risk score (Arnett DK, et al., 2019) is: 2.6%    Assessment & Plan:          Shannon J Heaton, NP  

## 2022-03-28 ENCOUNTER — Other Ambulatory Visit: Payer: Self-pay | Admitting: Nurse Practitioner

## 2022-03-28 ENCOUNTER — Ambulatory Visit: Payer: 59 | Admitting: Nurse Practitioner

## 2022-03-28 ENCOUNTER — Encounter: Payer: Self-pay | Admitting: Nurse Practitioner

## 2022-03-28 VITALS — BP 102/64 | HR 91 | Temp 97.1°F | Ht 62.0 in | Wt 189.2 lb

## 2022-03-28 DIAGNOSIS — K219 Gastro-esophageal reflux disease without esophagitis: Secondary | ICD-10-CM | POA: Diagnosis not present

## 2022-03-28 DIAGNOSIS — E782 Mixed hyperlipidemia: Secondary | ICD-10-CM | POA: Diagnosis not present

## 2022-03-28 DIAGNOSIS — E6609 Other obesity due to excess calories: Secondary | ICD-10-CM

## 2022-03-28 DIAGNOSIS — Z2821 Immunization not carried out because of patient refusal: Secondary | ICD-10-CM

## 2022-03-28 DIAGNOSIS — E1169 Type 2 diabetes mellitus with other specified complication: Secondary | ICD-10-CM

## 2022-03-28 DIAGNOSIS — G4700 Insomnia, unspecified: Secondary | ICD-10-CM

## 2022-03-28 DIAGNOSIS — Z6834 Body mass index (BMI) 34.0-34.9, adult: Secondary | ICD-10-CM

## 2022-03-28 MED ORDER — OMEPRAZOLE 40 MG PO CPDR: 40.0000 mg | DELAYED_RELEASE_CAPSULE | Freq: Every morning | ORAL | 2 refills | Status: DC

## 2022-03-28 MED ORDER — ZOLPIDEM TARTRATE 10 MG PO TABS: 10.0000 mg | ORAL_TABLET | Freq: Every evening | ORAL | 0 refills | Status: DC | PRN

## 2022-03-28 MED ORDER — ROSUVASTATIN CALCIUM 20 MG PO TABS: 20.0000 mg | ORAL_TABLET | Freq: Every day | ORAL | 2 refills | Status: DC

## 2022-03-28 NOTE — Patient Instructions (Signed)
We will call you with lab results Continue medications Follow-up in 22-month, fasting   Fatty Liver Disease  The liver converts food into energy, removes toxic material from the blood, makes important proteins, and absorbs necessary vitamins from food. Fatty liver disease occurs when too much fat has built up in your liver cells. Fatty liver disease is also called hepatic steatosis. In many cases, fatty liver disease does not cause symptoms or problems. It is often diagnosed when tests are being done for other reasons. However, over time, fatty liver can cause inflammation that may lead to more serious liver problems, such as scarring of the liver (cirrhosis) and liver failure. Fatty liver is associated with insulin resistance, increased body fat, high blood pressure (hypertension), and high cholesterol. These are features of metabolic syndrome and increase your risk for stroke, diabetes, and heart disease. What are the causes? This condition may be caused by components of metabolic syndrome: Obesity. Insulin resistance. High cholesterol. Other causes: Alcohol abuse. Poor nutrition. Cushing syndrome. Pregnancy. Certain drugs. Poisons. Some viral infections. What increases the risk? You are more likely to develop this condition if you: Abuse alcohol. Are overweight. Have diabetes. Have hepatitis. Have a high triglyceride level. Are pregnant. What are the signs or symptoms? Fatty liver disease often does not cause symptoms. If symptoms do develop, they can include: Fatigue and weakness. Weight loss. Confusion. Nausea, vomiting, or abdominal pain. Yellowing of your skin and the white parts of your eyes (jaundice). Itchy skin. How is this diagnosed? This condition may be diagnosed by: A physical exam and your medical history. Blood tests. Imaging tests, such as an ultrasound, CT scan, or MRI. A liver biopsy. A small sample of liver tissue is removed using a needle. The sample  is then looked at under a microscope. How is this treated? Fatty liver disease is often caused by other health conditions. Treatment for fatty liver may involve medicines and lifestyle changes to manage conditions such as: Alcoholism. High cholesterol. Diabetes. Being overweight or obese. Follow these instructions at home:  Do not drink alcohol. If you have trouble quitting, ask your health care provider how to safely quit with the help of medicine or a supervised program. This is important to keep your condition from getting worse. Eat a healthy diet as told by your health care provider. Ask your health care provider about working with a dietitian to develop an eating plan. Exercise regularly. This can help you lose weight and control your cholesterol and diabetes. Talk to your health care provider about an exercise plan and which activities are best for you. Take over-the-counter and prescription medicines only as told by your health care provider. Keep all follow-up visits. This is important. Contact a health care provider if: You have trouble controlling your: Blood sugar. This is especially important if you have diabetes. Cholesterol. Drinking of alcohol. Get help right away if: You have abdominal pain. You have jaundice. You have nausea and are vomiting. You vomit blood or material that looks like coffee grounds. You have stools that are black, tar-like, or bloody. Summary Fatty liver disease develops when too much fat builds up in the cells of your liver. Fatty liver disease often causes no symptoms or problems. However, over time, fatty liver can cause inflammation that may lead to more serious liver problems, such as scarring of the liver (cirrhosis). You are more likely to develop this condition if you abuse alcohol, are pregnant, are overweight, have diabetes, have hepatitis, or have high triglyceride  or cholesterol levels. Contact your health care provider if you have trouble  controlling your blood sugar, cholesterol, or drinking of alcohol. This information is not intended to replace advice given to you by your health care provider. Make sure you discuss any questions you have with your health care provider. Document Revised: 03/05/2020 Document Reviewed: 03/05/2020 Elsevier Patient Education  [place].

## 2022-03-31 LAB — CBC WITH DIFFERENTIAL/PLATELET
Basophils Absolute: 0 10*3/uL (ref 0.0–0.2)
Basos: 1 %
EOS (ABSOLUTE): 0.1 10*3/uL (ref 0.0–0.4)
Eos: 1 %
Hematocrit: 34.9 % (ref 34.0–46.6)
Hemoglobin: 11.7 g/dL (ref 11.1–15.9)
Immature Grans (Abs): 0 10*3/uL (ref 0.0–0.1)
Immature Granulocytes: 0 %
Lymphocytes Absolute: 1.2 10*3/uL (ref 0.7–3.1)
Lymphs: 24 %
MCH: 31 pg (ref 26.6–33.0)
MCHC: 33.5 g/dL (ref 31.5–35.7)
MCV: 92 fL (ref 79–97)
Monocytes Absolute: 0.4 10*3/uL (ref 0.1–0.9)
Monocytes: 8 %
Neutrophils Absolute: 3.2 10*3/uL (ref 1.4–7.0)
Neutrophils: 66 %
Platelets: 240 10*3/uL (ref 150–450)
RBC: 3.78 x10E6/uL (ref 3.77–5.28)
RDW: 13.6 % (ref 11.7–15.4)
WBC: 4.9 10*3/uL (ref 3.4–10.8)

## 2022-03-31 LAB — LIPID PANEL
Chol/HDL Ratio: 4.2 ratio (ref 0.0–4.4)
Cholesterol, Total: 156 mg/dL (ref 100–199)
HDL: 37 mg/dL — ABNORMAL LOW (ref >39)
LDL Chol Calc (NIH): 65 mg/dL (ref 0–99)
Triglycerides: 346 mg/dL — ABNORMAL HIGH (ref 0–149)
VLDL Cholesterol Cal: 54 mg/dL — ABNORMAL HIGH (ref 5–40)

## 2022-03-31 LAB — COMPREHENSIVE METABOLIC PANEL
ALT: 29 IU/L (ref 0–32)
AST: 24 IU/L (ref 0–40)
Albumin/Globulin Ratio: 2.1 (ref 1.2–2.2)
Albumin: 4.7 g/dL (ref 3.8–4.9)
Alkaline Phosphatase: 71 IU/L (ref 44–121)
BUN/Creatinine Ratio: 14 (ref 9–23)
BUN: 10 mg/dL (ref 6–24)
Bilirubin Total: 0.6 mg/dL (ref 0.0–1.2)
CO2: 23 mmol/L (ref 20–29)
Calcium: 9.3 mg/dL (ref 8.7–10.2)
Chloride: 103 mmol/L (ref 96–106)
Creatinine, Ser: 0.73 mg/dL (ref 0.57–1.00)
Globulin, Total: 2.2 g/dL (ref 1.5–4.5)
Glucose: 96 mg/dL (ref 70–99)
Potassium: 3.8 mmol/L (ref 3.5–5.2)
Sodium: 140 mmol/L (ref 134–144)
Total Protein: 6.9 g/dL (ref 6.0–8.5)
eGFR: 99 mL/min/{1.73_m2} (ref >59)

## 2022-03-31 LAB — CARDIOVASCULAR RISK ASSESSMENT

## 2022-03-31 LAB — HEMOGLOBIN A1C
Est. average glucose Bld gHb Est-mCnc: 80 mg/dL
Hgb A1c MFr Bld: 4.4 % — ABNORMAL LOW (ref 4.8–5.6)

## 2022-03-31 LAB — MICROALBUMIN / CREATININE URINE RATIO
Creatinine, Urine: 157.4 mg/dL
Microalb/Creat Ratio: 4 mg/g creat (ref 0–29)
Microalbumin, Urine: 5.9 ug/mL

## 2022-04-04 ENCOUNTER — Other Ambulatory Visit: Payer: Self-pay | Admitting: Nurse Practitioner

## 2022-04-04 DIAGNOSIS — E781 Pure hyperglyceridemia: Secondary | ICD-10-CM

## 2022-04-04 NOTE — Progress Notes (Signed)
error 

## 2022-04-17 ENCOUNTER — Other Ambulatory Visit: Payer: Self-pay | Admitting: Nurse Practitioner

## 2022-04-17 DIAGNOSIS — M542 Cervicalgia: Secondary | ICD-10-CM

## 2022-04-26 ENCOUNTER — Ambulatory Visit: Payer: 59 | Attending: Internal Medicine | Admitting: Internal Medicine

## 2022-04-26 ENCOUNTER — Encounter: Payer: Self-pay | Admitting: Internal Medicine

## 2022-04-26 VITALS — BP 116/72 | HR 96 | Ht 62.0 in | Wt 187.8 lb

## 2022-04-26 DIAGNOSIS — Z8249 Family history of ischemic heart disease and other diseases of the circulatory system: Secondary | ICD-10-CM

## 2022-04-26 DIAGNOSIS — E782 Mixed hyperlipidemia: Secondary | ICD-10-CM

## 2022-04-26 NOTE — Progress Notes (Signed)
LIPID CLINIC CONSULT NOTE   Chief Complaint:  High triglycerides  Primary Care Physician: Madison Harbour, NP  Primary Cardiologist:  None  HPI:  Madison Carey is a 51 y.o. female who is being seen today for the evaluation of high triglycerides at the request of Madison Harbour, NP. This is a pleasant 51 year old female kindly referred for evaluation management of high triglycerides.  She reports a longstanding history of high triglycerides in the past as high as 600.  She has been on high-dose rosuvastatin 20 mg daily and Lovaza 2 g twice daily.  She is also recently lost about 30 pounds with Ozempic and changes in her diet but her triglycerides remain elevated.  Recent lipids in October showed total cholesterol 156, triglycerides 346, HDL 37 and LDL 65.  Her father had a strong family history of heart disease with an MI in his 59s or 64s and ultimately died at age 69.  She said that he had "protein in his heart".  She said she had also been tested that previously with a comprehensive blood panel, possibly a [place] heart test that suggested she might have "proteins".  She is going to try to see if she can find this prior test information however it is concerning that there is early onset heart disease suggesting that her lipids may be pathogenic.  She had not previously been on a fibrate although there is little evidence of fibrate to reduce cardiovascular risk.  PMHx:  Past Medical History:  Diagnosis Date   Adjustment insomnia    Diabetes mellitus without complication (HCC)    GERD (gastroesophageal reflux disease)    Hypertension    Impaired fasting glucose    Mixed hyperlipidemia    Other vitamin B12 deficiency anemias    Plantar fascial fibromatosis    Right arm fracture     Past Surgical History:  Procedure Laterality Date   ABDOMINAL HYSTERECTOMY     bladder tacking      CHOLECYSTECTOMY     crevical ablation      CYSTOSCOPY N/A 02/16/2016   Procedure: CYSTOSCOPY;   Surgeon: Madison Loser, MD;  Location: [facility];  Service: Urology;  Laterality: N/A;   REVISION URINARY SLING N/A 02/16/2016   Procedure: REMOVAL URINARY SLING;  Surgeon: Madison Loser, MD;  Location: [facility];  Service: Urology;  Laterality: N/A;    FAMHx:  Family History  Problem Relation Age of Onset   COPD Mother    Asthma Mother    Heart disease Father    Hyperlipidemia Father    Diabetes Father    Colon cancer Neg Hx    Pancreatic cancer Neg Hx    Stomach cancer Neg Hx    Liver disease Neg Hx     SOCHx:   reports that she quit smoking about 3 years ago. Her smoking use included cigarettes. She has a 1.25 pack-year smoking history. She has never used smokeless tobacco. She reports current alcohol use of about 2.0 standard drinks of alcohol per week. She reports that she does not use drugs.  ALLERGIES:  Allergies  Allergen Reactions   Codeine Itching   Sulfa Antibiotics Other (See Comments)    ROS: Pertinent items noted in HPI and remainder of comprehensive ROS otherwise negative.  HOME MEDS: Current Outpatient Medications on File Prior to Visit  Medication Sig Dispense Refill   baclofen (LIORESAL) 10 MG tablet Take 1 tablet (10 mg total) by mouth 3 (three) times daily. 30 each 2  cyclobenzaprine (FLEXERIL) 10 MG tablet TAKE ONE TABLET BY MOUTH 3 TIMES DAILY AS NEEDED FOR MUSCLE SPASMS 90 tablet 1   ibuprofen (ADVIL) 800 MG tablet Take 1 tablet (800 mg total) by mouth every 8 (eight) hours as needed. 90 tablet 0   omega-3 acid ethyl esters (LOVAZA) 1 g capsule Take 2 capsules (2 g total) by mouth 2 (two) times daily. 360 capsule 0   omeprazole (PRILOSEC) 40 MG capsule Take 1 capsule (40 mg total) by mouth every morning. 90 capsule 2   OZEMPIC, 2 MG/DOSE, 8 MG/3ML SOPN INJECT 2 mg AS DIRECTED ONCE A WEEK 3 mL 3   rosuvastatin (CRESTOR) 20 MG tablet Take 1 tablet (20 mg total) by mouth daily. as directed 90 tablet 2   zolpidem (AMBIEN) 10 MG tablet Take 1 tablet (10 mg  total) by mouth at bedtime as needed. 30 tablet 0   No current facility-administered medications on file prior to visit.    LABS/IMAGING: No results found for this or any previous visit (from the past 48 hour(s)). No results found.  LIPID PANEL:    Component Value Date/Time   CHOL 156 03/28/2022 0751   TRIG 346 (H) 03/28/2022 0751   HDL 37 (L) 03/28/2022 0751   CHOLHDL 4.2 03/28/2022 0751   LDLCALC 65 03/28/2022 0751    WEIGHTS: Wt Readings from Last 3 Encounters:  04/26/22 187 lb 12.8 oz (85.2 kg)  03/28/22 189 lb 3.2 oz (85.8 kg)  12/20/21 192 lb (87.1 kg)    VITALS: BP 116/72 (BP Location: Left Arm, Patient Position: Sitting)   Pulse 96   Ht '5\' 2"'$  (1.575 m)   Wt 187 lb 12.8 oz (85.2 kg)   SpO2 96%   BMI 34.35 kg/m   EXAM: General appearance: alert and no distress Neck: no carotid bruit, no JVD, and thyroid not enlarged, symmetric, no tenderness/mass/nodules Lungs: clear to auscultation bilaterally Heart: regular rate and rhythm, S1, S2 normal, no murmur, click, rub or gallop Abdomen: soft, non-tender; bowel sounds normal; no masses,  no organomegaly Extremities: extremities normal, atraumatic, no cyanosis or edema Pulses: 2+ and symmetric Skin: Skin color, texture, turgor normal. No rashes or lesions Neurologic: Grossly normal Psych: Pleasant  EKG: Deferred  ASSESSMENT: Mixed dyslipidemia with high triglycerides Strong family history of early onset heart disease in her father  PLAN: 1.   [name] has a mixed dyslipidemia with high triglycerides.  She may be having some statin side effects.  She reports overall joint pains and soreness on rosuvastatin 20 mg daily which she says has become worse over time on the medicine.  This could be statin myalgias.  I advised her to take a 2-week statin holiday.  We will go ahead and check an LP(a) today given her strong family history of early heart disease.  I did also like to get a calcium score to assess her  coronaries.  She is in agreement with this.  We may need to consider reducing the dose of her Crestor or an alternative statin.  If her LP(a) is elevated, I would also try to get her on a PCSK9 inhibitor.  She did mention that her father died of protein in his heart.  I wonder if this could be amyloid and she mention that she was tested and may have had the same protein.  She is going to try to get that information and it is important because there are treatments for this now.  Plan follow-up with me in about  3 to 4 months.  Thanks again for the kind referral.  Madison Casino, MD, FACC, [place] Director of the Advanced Lipid Disorders &  Cardiovascular Risk Reduction Clinic Diplomate of the American Board of Clinical Lipidology Attending Cardiologist  Direct Dial: [phone]  Fax: [phone]  Website:  [email or web address] 04/26/2022, 10:06 AM

## 2022-04-26 NOTE — Patient Instructions (Addendum)
Medication Instructions:  Please hold rosuvastatin for TWO WEEKS Report back to the office on how you are feeling off the medication You can call or send message in [place]  *If you need a refill on your cardiac medications before your next appointment, please call your pharmacy*   Lab Work: Lipoprotein A (LPa) today   If you have labs (blood work) drawn today and your tests are completely normal, you will receive your results only by: [place] (if you have MyChart) OR A paper copy in the mail If you have any lab test that is abnormal or we need to change your treatment, we will call you to review the results.   Testing/Procedures: Dr. Debara Pickett has ordered a CT coronary calcium score.   Test locations:  [employer]   This is $99 out of pocket.   Coronary CalciumScan A coronary calcium scan is an imaging test used to look for deposits of calcium and other fatty materials (plaques) in the inner lining of the blood vessels of the heart (coronary arteries). These deposits of calcium and plaques can partly clog and narrow the coronary arteries without producing any symptoms or warning signs. This puts a person at risk for a heart attack. This test can detect these deposits before symptoms develop. Tell a health care provider about: Any allergies you have. All medicines you are taking, including vitamins, herbs, eye drops, creams, and over-the-counter medicines. Any problems you or family members have had with anesthetic medicines. Any blood disorders you have. Any surgeries you have had. Any medical conditions you have. Whether you are pregnant or may be pregnant. What are the risks? Generally, this is a safe procedure. However, problems may occur, including: Harm to a pregnant woman and her unborn baby. This test involves the use of radiation. Radiation exposure can be dangerous to a pregnant woman and her unborn baby. If you are pregnant, you  generally should not have this procedure done. Slight increase in the risk of cancer. This is because of the radiation involved in the test. What happens before the procedure? No preparation is needed for this procedure. What happens during the procedure? You will undress and remove any jewelry around your neck or chest. You will put on a hospital gown. Sticky electrodes will be placed on your chest. The electrodes will be connected to an electrocardiogram (ECG) machine to record a tracing of the electrical activity of your heart. A CT scanner will take pictures of your heart. During this time, you will be asked to lie still and hold your breath for 2-3 seconds while a picture of your heart is being taken. The procedure may vary among health care providers and hospitals. What happens after the procedure? You can get dressed. You can return to your normal activities. It is up to you to get the results of your test. Ask your health care provider, or the department that is doing the test, when your results will be ready. Summary A coronary calcium scan is an imaging test used to look for deposits of calcium and other fatty materials (plaques) in the inner lining of the blood vessels of the heart (coronary arteries). Generally, this is a safe procedure. Tell your health care provider if you are pregnant or may be pregnant. No preparation is needed for this procedure. A CT scanner will take pictures of your heart. You can return to your normal activities after the scan is done. This information is not intended to replace  advice given to you by your health care provider. Make sure you discuss any questions you have with your health care provider. Document Released: 11/19/2007 Document Revised: 04/11/2016 Document Reviewed: 04/11/2016 Elsevier Interactive Patient Education  2017 [place]: At [facility], you and your health needs are our priority.  As part of our  continuing mission to provide you with exceptional heart care, we have created designated Provider Care Teams.  These Care Teams include your primary Cardiologist (physician) and Advanced Practice Providers (APPs -  Physician Assistants and Nurse Practitioners) who all work together to provide you with the care you need, when you need it.  We recommend signing up for the patient portal called "MyChart".  Sign up information is provided on this After Visit Summary.  MyChart is used to connect with patients for Virtual Visits (Telemedicine).  Patients are able to view lab/test results, encounter notes, upcoming appointments, etc.  Non-urgent messages can be sent to your provider as well.   To learn more about what you can do with MyChart, go to [email or web address].    Your next appointment:    4 months with Dr. Debara Pickett     If you are able to locate your prior records, you can bring by the office or fax to [phone]

## 2022-04-27 LAB — LIPOPROTEIN A (LPA): Lipoprotein (a): 251.4 nmol/L — ABNORMAL HIGH (ref <75.0)

## 2022-05-14 ENCOUNTER — Other Ambulatory Visit: Payer: Self-pay | Admitting: Nurse Practitioner

## 2022-05-14 DIAGNOSIS — G4700 Insomnia, unspecified: Secondary | ICD-10-CM

## 2022-05-16 ENCOUNTER — Other Ambulatory Visit: Payer: Self-pay

## 2022-05-16 DIAGNOSIS — E889 Metabolic disorder, unspecified: Secondary | ICD-10-CM

## 2022-05-16 DIAGNOSIS — E1169 Type 2 diabetes mellitus with other specified complication: Secondary | ICD-10-CM

## 2022-05-16 DIAGNOSIS — Z6835 Body mass index (BMI) 35.0-35.9, adult: Secondary | ICD-10-CM

## 2022-05-16 DIAGNOSIS — Z833 Family history of diabetes mellitus: Secondary | ICD-10-CM

## 2022-05-16 DIAGNOSIS — E88819 Insulin resistance, unspecified: Secondary | ICD-10-CM

## 2022-05-16 MED ORDER — OZEMPIC (2 MG/DOSE) 8 MG/3ML ~~LOC~~ SOPN: PEN_INJECTOR | SUBCUTANEOUS | 3 refills | Status: DC

## 2022-05-31 ENCOUNTER — Encounter (HOSPITAL_BASED_OUTPATIENT_CLINIC_OR_DEPARTMENT_OTHER): Payer: Self-pay

## 2022-05-31 ENCOUNTER — Ambulatory Visit (HOSPITAL_BASED_OUTPATIENT_CLINIC_OR_DEPARTMENT_OTHER)
Admission: RE | Admit: 2022-05-31 | Discharge: 2022-05-31 | Disposition: A | Discharge status: Home / Self Care | Payer: 59 | Source: Ambulatory Visit | Attending: Internal Medicine | Admitting: Internal Medicine

## 2022-05-31 DIAGNOSIS — E782 Mixed hyperlipidemia: Secondary | ICD-10-CM | POA: Insufficient documentation

## 2022-06-09 ENCOUNTER — Other Ambulatory Visit: Payer: Self-pay

## 2022-06-09 DIAGNOSIS — G4700 Insomnia, unspecified: Secondary | ICD-10-CM

## 2022-06-09 LAB — NMR, LIPOPROFILE
Cholesterol, Total: 208 mg/dL — ABNORMAL HIGH (ref 100–199)
HDL Particle Number: 36.4 umol/L (ref >=30.5)
HDL-C: 36 mg/dL — ABNORMAL LOW (ref >39)
LDL Particle Number: 1380 nmol/L — ABNORMAL HIGH (ref <1000)
LDL Size: 19.5 nm — ABNORMAL LOW (ref >20.5)
LDL-C (NIH Calc): 91 mg/dL (ref 0–99)
LP-IR Score: 84 — ABNORMAL HIGH (ref <=45)
Small LDL Particle Number: 1098 nmol/L — ABNORMAL HIGH (ref <=527)
Triglycerides: 488 mg/dL — ABNORMAL HIGH (ref 0–149)

## 2022-06-13 ENCOUNTER — Telehealth: Payer: Self-pay | Admitting: Internal Medicine

## 2022-06-13 ENCOUNTER — Other Ambulatory Visit: Payer: Self-pay

## 2022-06-13 DIAGNOSIS — E782 Mixed hyperlipidemia: Secondary | ICD-10-CM

## 2022-06-13 DIAGNOSIS — M542 Cervicalgia: Secondary | ICD-10-CM

## 2022-06-13 MED ORDER — CYCLOBENZAPRINE HCL 10 MG PO TABS: ORAL_TABLET | ORAL | 1 refills | Status: DC

## 2022-06-13 NOTE — Telephone Encounter (Signed)
PA for repatha submitted via CMM (Key: [place])

## 2022-06-14 ENCOUNTER — Other Ambulatory Visit: Payer: Self-pay | Admitting: Nurse Practitioner

## 2022-06-14 DIAGNOSIS — G4700 Insomnia, unspecified: Secondary | ICD-10-CM

## 2022-06-14 NOTE — Telephone Encounter (Signed)
Would start zetia 10 mg daily - this will not impact her LP(a), but will help with overall lipids. Repeat lipid NMR in 3-4 months.   Dr. Lemmie Evens

## 2022-06-14 NOTE — Telephone Encounter (Signed)
Why was my request denied? This request was denied because you did not meet the following clinical requirements: Based on the information provided, you do not meet the established medication-specific criteria or guidelines for Repatha Sure[employer] at this time. The request for coverage for REPATHA SURE INJ '140MG'$ /ML, use as directed (6 per 3 months), is denied. This decision is based on health plan criteria for REPATHA SURE INJ '140MG'$ /ML. This medicine is covered only if: (1) Primary hyperlipidemia with pre-treatment low-density lipoprotein cholesterol greater than or equal to 190 mg/dL. (2) One of the following: (A) One of the following low-density lipoprotein cholesterol values while on maximally tolerated lipid lowering therapy for a minimum of at least 12 weeks within the last 120 days or 120 days prior to starting proprotein convertase subtilisin/kexin type 9 inhibitor therapy: (I) Low-density lipoprotein cholesterol greater than or equal to '100mg'$ /dL with atherosclerotic cardiovascular disease. (II) Low-density lipoprotein cholesterol greater than or equal to '130mg'$ /dL without atherosclerotic cardiovascular disease. (B) Both of the following: [phone] All [employer] trademarks and logos are owned by [place] other brand or product names are trademarks or registered marks of their respective owners. All rights reserved. Page 2 of 14 (I) One of the following low-density lipoprotein cholesterol values while on maximally tolerated lipid lowering therapy for a minimum of at least 12 weeks within the last 120 days or 120 days prior to starting proprotein convertase subtilisin/kexin type 9 inhibitor therapy: (a) Low-density lipoprotein cholesterol between '55mg'$ /dL and '99mg'$ /dL with atherosclerotic cardiovascular disease. (b) Low-density lipoprotein cholesterol between '100mg'$ /dL and '129mg'$ /dL without atherosclerotic cardiovascular disease. (II) One of the following: (a) You have been  receiving at least 12 consecutive weeks of ezetimibe (Zetia) therapy as adjunct to maximally tolerated statin therapy. (b) You have a history of contraindication, or intolerance to ezetimibe. The information provided does not show that you meet the criteria listed above.

## 2022-06-15 MED ORDER — EZETIMIBE 10 MG PO TABS: 10.0000 mg | ORAL_TABLET | Freq: Every day | ORAL | 3 refills | Status: DC

## 2022-06-15 NOTE — Addendum Note (Signed)
Addended by: Fidel Levy on: 06/15/2022 08:05 AM   Modules accepted: Orders

## 2022-06-15 NOTE — Telephone Encounter (Signed)
Patient agreeable to start zetia. Rx(s) sent to pharmacy electronically.

## 2022-07-15 ENCOUNTER — Other Ambulatory Visit: Payer: Self-pay

## 2022-07-15 DIAGNOSIS — G4700 Insomnia, unspecified: Secondary | ICD-10-CM

## 2022-07-15 MED ORDER — ZOLPIDEM TARTRATE 10 MG PO TABS: ORAL_TABLET | ORAL | 2 refills | Status: DC

## 2022-08-20 LAB — NMR, LIPOPROFILE
Cholesterol, Total: 176 mg/dL (ref 100–199)
HDL Particle Number: 29.6 umol/L — ABNORMAL LOW (ref >=30.5)
HDL-C: 37 mg/dL — ABNORMAL LOW (ref >39)
LDL Particle Number: 1088 nmol/L — ABNORMAL HIGH (ref <1000)
LDL Size: 20.2 nm — ABNORMAL LOW (ref >20.5)
LDL-C (NIH Calc): 87 mg/dL (ref 0–99)
LP-IR Score: 70 — ABNORMAL HIGH (ref <=45)
Small LDL Particle Number: 677 nmol/L — ABNORMAL HIGH (ref <=527)
Triglycerides: 316 mg/dL — ABNORMAL HIGH (ref 0–149)

## 2022-08-22 ENCOUNTER — Ambulatory Visit: Payer: 59 | Admitting: Internal Medicine

## 2022-08-22 ENCOUNTER — Ambulatory Visit: Payer: 59 | Attending: Internal Medicine | Admitting: Internal Medicine

## 2022-08-22 ENCOUNTER — Encounter: Payer: Self-pay | Admitting: Internal Medicine

## 2022-08-22 VITALS — BP 112/72 | HR 84 | Ht 62.0 in | Wt 180.6 lb

## 2022-08-22 DIAGNOSIS — Z8249 Family history of ischemic heart disease and other diseases of the circulatory system: Secondary | ICD-10-CM | POA: Diagnosis not present

## 2022-08-22 DIAGNOSIS — E7841 Elevated Lipoprotein(a): Secondary | ICD-10-CM | POA: Diagnosis not present

## 2022-08-22 DIAGNOSIS — E782 Mixed hyperlipidemia: Secondary | ICD-10-CM

## 2022-08-22 MED ORDER — ROSUVASTATIN CALCIUM 5 MG PO TABS: 5.0000 mg | ORAL_TABLET | Freq: Every day | ORAL | 3 refills | Status: DC

## 2022-08-22 MED ORDER — OMEGA-3-ACID ETHYL ESTERS 1 G PO CAPS: 2.0000 g | ORAL_CAPSULE | Freq: Two times a day (BID) | ORAL | 3 refills | Status: DC

## 2022-08-22 NOTE — Patient Instructions (Signed)
Medication Instructions:  START crestor 5mg  daily  *If you need a refill on your cardiac medications before your next appointment, please call your pharmacy*   Lab Work: FASTING lab work to check cholesterol in 4 months ** complete about one week before next visit  If you have labs (blood work) drawn today and your tests are completely normal, you will receive your results only by: [place] (if you have MyChart) OR A paper copy in the mail If you have any lab test that is abnormal or we need to change your treatment, we will call you to review the results.   Testing/Procedures: NONE   Follow-Up: At [facility], you and your health needs are our priority.  As part of our continuing mission to provide you with exceptional heart care, we have created designated Provider Care Teams.  These Care Teams include your primary Cardiologist (physician) and Advanced Practice Providers (APPs -  Physician Assistants and Nurse Practitioners) who all work together to provide you with the care you need, when you need it.  We recommend signing up for the patient portal called "MyChart".  Sign up information is provided on this After Visit Summary.  MyChart is used to connect with patients for Virtual Visits (Telemedicine).  Patients are able to view lab/test results, encounter notes, upcoming appointments, etc.  Non-urgent messages can be sent to your provider as well.   To learn more about what you can do with MyChart, go to [email or web address].    Your next appointment:    4 months with Dr. Debara Pickett -- in person or video visit

## 2022-08-22 NOTE — Progress Notes (Signed)
LIPID CLINIC CONSULT NOTE   Chief Complaint:  High triglycerides  Primary Care Physician: Rip Harbour, NP (Inactive)  Primary Cardiologist:  None  HPI:  Madison Carey is a 52 y.o. female who is being seen today for the evaluation of high triglycerides at the request of Rip Harbour, NP. This is a pleasant 52 year old female kindly referred for evaluation management of high triglycerides.  She reports a longstanding history of high triglycerides in the past as high as 600.  She has been on high-dose rosuvastatin 20 mg daily and Lovaza 2 g twice daily.  She is also recently lost about 30 pounds with Ozempic and changes in her diet but her triglycerides remain elevated.  Recent lipids in October showed total cholesterol 156, triglycerides 346, HDL 37 and LDL 65.  Her father had a strong family history of heart disease with an MI in his 23s or 49s and ultimately died at age 40.  She said that he had "protein in his heart".  She said she had also been tested that previously with a comprehensive blood panel, possibly a [place] heart test that suggested she might have "proteins".  She is going to try to see if she can find this prior test information however it is concerning that there is early onset heart disease suggesting that her lipids may be pathogenic.  She had not previously been on a fibrate although there is little evidence of fibrate to reduce cardiovascular risk.  08/22/2022  Madison Carey returns today for follow-up. She has had further improvement in her lipids on ezetimibe.  She tells me that she was not taking rosuvastatin as we had suggested, but despite this her cholesterol is lower.  LDL particle number now 1088, LDL-C is 87, HDL-C was 37 and triglycerides are 316.  She also is not taking her Lovaza.  PMHx:  Past Medical History:  Diagnosis Date   Adjustment insomnia    Diabetes mellitus without complication (HCC)    GERD (gastroesophageal reflux disease)     Hypertension    Impaired fasting glucose    Mixed hyperlipidemia    Other vitamin B12 deficiency anemias    Plantar fascial fibromatosis    Right arm fracture     Past Surgical History:  Procedure Laterality Date   ABDOMINAL HYSTERECTOMY     bladder tacking      CHOLECYSTECTOMY     crevical ablation      CYSTOSCOPY N/A 02/16/2016   Procedure: CYSTOSCOPY;  Surgeon: Bjorn Loser, MD;  Location: [facility];  Service: Urology;  Laterality: N/A;   REVISION URINARY SLING N/A 02/16/2016   Procedure: REMOVAL URINARY SLING;  Surgeon: Bjorn Loser, MD;  Location: [facility];  Service: Urology;  Laterality: N/A;    FAMHx:  Family History  Problem Relation Age of Onset   COPD Mother    Asthma Mother    Heart disease Father    Hyperlipidemia Father    Diabetes Father    Colon cancer Neg Hx    Pancreatic cancer Neg Hx    Stomach cancer Neg Hx    Liver disease Neg Hx     SOCHx:   reports that she quit smoking about 4 years ago. Her smoking use included cigarettes. She has a 1.25 pack-year smoking history. She has never used smokeless tobacco. She reports current alcohol use of about 2.0 standard drinks of alcohol per week. She reports that she does not use drugs.  ALLERGIES:  Allergies  Allergen Reactions  Codeine Itching   Sulfa Antibiotics Other (See Comments)    ROS: Pertinent items noted in HPI and remainder of comprehensive ROS otherwise negative.  HOME MEDS: Current Outpatient Medications on File Prior to Visit  Medication Sig Dispense Refill   baclofen (LIORESAL) 10 MG tablet Take 1 tablet (10 mg total) by mouth 3 (three) times daily. 30 each 2   cyclobenzaprine (FLEXERIL) 10 MG tablet Take one tablet by mouth daily. 90 tablet 1   ezetimibe (ZETIA) 10 MG tablet Take 1 tablet (10 mg total) by mouth daily. 90 tablet 3   ibuprofen (ADVIL) 800 MG tablet Take 1 tablet (800 mg total) by mouth every 8 (eight) hours as needed. 90 tablet 0   omega-3 acid ethyl esters (LOVAZA) 1 g  capsule Take 2 capsules (2 g total) by mouth 2 (two) times daily. 360 capsule 0   omeprazole (PRILOSEC) 40 MG capsule Take 1 capsule (40 mg total) by mouth every morning. 90 capsule 2   rosuvastatin (CRESTOR) 20 MG tablet      Semaglutide, 2 MG/DOSE, (OZEMPIC, 2 MG/DOSE,) 8 MG/3ML SOPN INJECT 2 mg AS DIRECTED ONCE A WEEK 3 mL 3   zolpidem (AMBIEN) 10 MG tablet TAKE 1 TABLET BY MOUTH NIGHTLY AT BEDTIME AS NEEDED 30 tablet 2   No current facility-administered medications on file prior to visit.    LABS/IMAGING: No results found for this or any previous visit (from the past 48 hour(s)). No results found.  LIPID PANEL:    Component Value Date/Time   CHOL 156 03/28/2022 0751   TRIG 346 (H) 03/28/2022 0751   HDL 37 (L) 03/28/2022 0751   CHOLHDL 4.2 03/28/2022 0751   LDLCALC 65 03/28/2022 0751    WEIGHTS: Wt Readings from Last 3 Encounters:  08/22/22 180 lb 9.6 oz (81.9 kg)  04/26/22 187 lb 12.8 oz (85.2 kg)  03/28/22 189 lb 3.2 oz (85.8 kg)    VITALS: BP 112/72   Pulse 84   Ht 5\' 2"  (1.575 m)   Wt 180 lb 9.6 oz (81.9 kg)   SpO2 98%   BMI 33.03 kg/m   EXAM: Deferred  EKG: Deferred  ASSESSMENT: Mixed dyslipidemia with high triglycerides Elevated LP(a) of 251.4 nmol/L. Strong family history of early onset heart disease in her father  PLAN: 1.   Madison Carey has had further reduction in her lipids but it seems that she is only on ezetimibe.  She was previously supposed to be on both ezetimibe and rosuvastatin 20 mg daily.  She did have some side effects on the higher dose rosuvastatin.  I think would be reasonable for her to go back on statin therapy but will trial 5 mg since her cholesterol is much better.  In addition she should restart her Lovaza.  Will plan repeat lipids in about 3 to 4 months.  Her cholesterol may not budge a lot because her LP(a) is elevated.  She unfortunately was denied adding a PCSK9 inhibitor.  Follow-up with me in about 4 months.  Pixie Casino, MD, [facility], [place] Director of the Advanced Lipid Disorders &  Cardiovascular Risk Reduction Clinic Diplomate of the American Board of Clinical Lipidology Attending Cardiologist  Direct Dial: [phone]  Fax: [phone]  Website:  [email or web address] Madison Carey 08/22/2022, 1:18 PM

## 2022-09-09 ENCOUNTER — Other Ambulatory Visit: Payer: Self-pay

## 2022-09-09 DIAGNOSIS — E1169 Type 2 diabetes mellitus with other specified complication: Secondary | ICD-10-CM

## 2022-09-09 DIAGNOSIS — E88819 Insulin resistance, unspecified: Secondary | ICD-10-CM

## 2022-09-09 DIAGNOSIS — Z6835 Body mass index (BMI) 35.0-35.9, adult: Secondary | ICD-10-CM

## 2022-09-09 DIAGNOSIS — Z833 Family history of diabetes mellitus: Secondary | ICD-10-CM

## 2022-09-09 MED ORDER — OZEMPIC (2 MG/DOSE) 8 MG/3ML ~~LOC~~ SOPN: PEN_INJECTOR | SUBCUTANEOUS | 3 refills | Status: DC

## 2022-09-12 LAB — HM DIABETES EYE EXAM

## 2022-10-03 ENCOUNTER — Other Ambulatory Visit: Payer: Self-pay | Admitting: Family Medicine

## 2022-10-03 DIAGNOSIS — G4700 Insomnia, unspecified: Secondary | ICD-10-CM

## 2022-10-04 ENCOUNTER — Encounter: Payer: Self-pay | Admitting: Physician Assistant

## 2022-10-04 ENCOUNTER — Ambulatory Visit: Payer: 59 | Admitting: Nurse Practitioner

## 2022-10-04 ENCOUNTER — Ambulatory Visit: Payer: 59 | Admitting: Physician Assistant

## 2022-10-04 VITALS — BP 106/60 | HR 84 | Temp 97.3°F | Resp 14 | Ht 62.0 in | Wt 181.0 lb

## 2022-10-04 DIAGNOSIS — J019 Acute sinusitis, unspecified: Secondary | ICD-10-CM | POA: Insufficient documentation

## 2022-10-04 DIAGNOSIS — K219 Gastro-esophageal reflux disease without esophagitis: Secondary | ICD-10-CM

## 2022-10-04 DIAGNOSIS — E782 Mixed hyperlipidemia: Secondary | ICD-10-CM

## 2022-10-04 DIAGNOSIS — E1169 Type 2 diabetes mellitus with other specified complication: Secondary | ICD-10-CM

## 2022-10-04 DIAGNOSIS — B9689 Other specified bacterial agents as the cause of diseases classified elsewhere: Secondary | ICD-10-CM

## 2022-10-04 LAB — CBC WITH DIFFERENTIAL/PLATELET
Basophils Absolute: 0 10*3/uL (ref 0.0–0.2)
Basos: 0 %
EOS (ABSOLUTE): 0.1 10*3/uL (ref 0.0–0.4)
Eos: 2 %
Hematocrit: 36.4 % (ref 34.0–46.6)
Hemoglobin: 11.9 g/dL (ref 11.1–15.9)
Immature Grans (Abs): 0 10*3/uL (ref 0.0–0.1)
Immature Granulocytes: 0 %
Lymphocytes Absolute: 0.8 10*3/uL (ref 0.7–3.1)
Lymphs: 18 %
MCH: 30.7 pg (ref 26.6–33.0)
MCHC: 32.7 g/dL (ref 31.5–35.7)
MCV: 94 fL (ref 79–97)
Monocytes Absolute: 0.5 10*3/uL (ref 0.1–0.9)
Monocytes: 11 %
Neutrophils Absolute: 3.1 10*3/uL (ref 1.4–7.0)
Neutrophils: 69 %
Platelets: 206 10*3/uL (ref 150–450)
RBC: 3.88 x10E6/uL (ref 3.77–5.28)
RDW: 13.7 % (ref 11.7–15.4)
WBC: 4.5 10*3/uL (ref 3.4–10.8)

## 2022-10-04 LAB — HEMOGLOBIN A1C
Est. average glucose Bld gHb Est-mCnc: 85 mg/dL
Hgb A1c MFr Bld: 4.6 % — ABNORMAL LOW (ref 4.8–5.6)

## 2022-10-04 MED ORDER — AZITHROMYCIN 250 MG PO TABS: ORAL_TABLET | ORAL | 0 refills | Status: DC

## 2022-10-04 NOTE — Progress Notes (Signed)
Subjective:  Patient ID: Madison Carey, female    DOB: 18-Jun-1970  Age: 52 y.o. MRN: [id number]  Chief Complaint  Patient presents with   Diabetes   Hyperlipidemia   Sinusitis    Sinus congestion started about 5-7 days ago but has gotten worse over the past 3 days. Denies fever, chills. Admits to headache. Admits to seasonal allergies that she usually takes Flonase for and works well for her. Has had a zpack and steroids before that have worked very well for her.   Went to [facility] here in [place] about 2-3 weeks ago for her Eye exam.   Patient did not want to get a Zoster or Tdap vaccine.    Hyperlipidemia:  Zetia 10 mg daily, lovaza 2 grams twice daily and crestor 5 mg daily.  Diabetes:  Ozempic 2 mg weekly.  GERD:  Omeprazole 40 mg daily.       12/20/2021    7:46 AM 09/17/2021    9:24 AM 05/03/2021    8:30 AM 01/13/2021    7:56 AM 07/16/2020    8:16 AM  Depression screen PHQ 2/9  Decreased Interest 0 0 0 0   Down, Depressed, Hopeless 0 0 0 0 0  PHQ - 2 Score 0 0 0 0 0  Altered sleeping 0    0  Tired, decreased energy 0    0  Change in appetite 0    0  Feeling bad or failure about yourself  0    0  Trouble concentrating 0    0  Moving slowly or fidgety/restless 0    0  Suicidal thoughts 0    0  PHQ-9 Score 0    0  Difficult doing work/chores     Not difficult at all         01/13/2021    7:56 AM 05/03/2021    8:30 AM 07/09/2021    8:46 AM 09/17/2021    9:23 AM 12/20/2021    7:46 AM  Fall Risk  Falls in the past year? 0 0 0 0 0  Was there an injury with Fall? 0 0 0 0 0  Fall Risk Category Calculator 0 0 0 0 0  Fall Risk Category (Retired) Low Low Low Low Low  (RETIRED) Patient Fall Risk Level Low fall risk Low fall risk Low fall risk Low fall risk Low fall risk  Patient at Risk for Falls Due to No Fall Risks No Fall Risks No Fall Risks  No Fall Risks  Fall risk Follow up Falls evaluation completed Falls evaluation completed Falls evaluation completed  Falls  evaluation completed      Review of Systems  Constitutional:  Positive for fatigue. Negative for chills.  HENT:  Positive for congestion. Negative for rhinorrhea and sore throat.   Respiratory:  Positive for cough. Negative for shortness of breath.   Cardiovascular:  Negative for chest pain.  Gastrointestinal:  Negative for abdominal pain, constipation, diarrhea, nausea and vomiting.  Genitourinary:  Negative for dysuria and urgency.  Musculoskeletal:  Positive for myalgias. Negative for back pain.  Neurological:  Positive for headaches. Negative for dizziness, weakness and light-headedness.  Psychiatric/Behavioral:  Negative for dysphoric mood. The patient is not nervous/anxious.     Current Outpatient Medications on File Prior to Visit  Medication Sig Dispense Refill   baclofen (LIORESAL) 10 MG tablet Take 1 tablet (10 mg total) by mouth 3 (three) times daily. 30 each 2   cyclobenzaprine (FLEXERIL) 10 MG tablet Take  one tablet by mouth daily. 90 tablet 1   ezetimibe (ZETIA) 10 MG tablet Take 1 tablet (10 mg total) by mouth daily. 90 tablet 3   ibuprofen (ADVIL) 800 MG tablet Take 1 tablet (800 mg total) by mouth every 8 (eight) hours as needed. 90 tablet 0   omega-3 acid ethyl esters (LOVAZA) 1 g capsule Take 2 capsules (2 g total) by mouth 2 (two) times daily. 360 capsule 3   omeprazole (PRILOSEC) 40 MG capsule Take 1 capsule (40 mg total) by mouth every morning. 90 capsule 2   rosuvastatin (CRESTOR) 5 MG tablet Take 1 tablet (5 mg total) by mouth daily. 90 tablet 3   Semaglutide, 2 MG/DOSE, (OZEMPIC, 2 MG/DOSE,) 8 MG/3ML SOPN INJECT 2 mg AS DIRECTED ONCE A WEEK 3 mL 3   zolpidem (AMBIEN) 10 MG tablet TAKE 1 TABLET BY MOUTH NIGHTLY AT BEDTIME AS NEEDED 30 tablet 0   No current facility-administered medications on file prior to visit.   Past Medical History:  Diagnosis Date   Adjustment insomnia    Diabetes mellitus without complication (HCC)    GERD (gastroesophageal reflux  disease)    Hypertension    Impaired fasting glucose    Mixed hyperlipidemia    Other vitamin B12 deficiency anemias    Plantar fascial fibromatosis    Right arm fracture    Past Surgical History:  Procedure Laterality Date   ABDOMINAL HYSTERECTOMY     bladder tacking      CHOLECYSTECTOMY     crevical ablation      CYSTOSCOPY N/A 02/16/2016   Procedure: CYSTOSCOPY;  Surgeon: Alfredo Martinez, MD;  Location: [facility];  Service: Urology;  Laterality: N/A;   REVISION URINARY SLING N/A 02/16/2016   Procedure: REMOVAL URINARY SLING;  Surgeon: Alfredo Martinez, MD;  Location: [facility];  Service: Urology;  Laterality: N/A;    Family History  Problem Relation Age of Onset   COPD Mother    Asthma Mother    Heart disease Father    Hyperlipidemia Father    Diabetes Father    Colon cancer Neg Hx    Pancreatic cancer Neg Hx    Stomach cancer Neg Hx    Liver disease Neg Hx    Social History   Socioeconomic History   Marital status: Married    Spouse name: Not on file   Number of children: 3   Years of education: Not on file   Highest education level: Not on file  Occupational History   Occupation: Solicitor  Tobacco Use   Smoking status: Former    Packs/day: 0.25    Years: 5.00    Additional pack years: 0.00    Total pack years: 1.25    Types: Cigarettes    Quit date: 2020    Years since quitting: 4.3   Smokeless tobacco: Never  Vaping Use   Vaping Use: Every day   Substances: Nicotine, Flavoring  Substance and Sexual Activity   Alcohol use: Yes    Alcohol/week: 2.0 standard drinks of alcohol    Types: 2 Glasses of wine per week    Comment: occasionally   Drug use: No   Sexual activity: Yes    Partners: Male  Other Topics Concern   Not on file  Social History Narrative   Not on file   Social Determinants of Health   Financial Resource Strain: Low Risk  (12/20/2021)   Overall Financial Resource Strain (CARDIA)    Difficulty of Paying Living Expenses:  Not hard at  all  Food Insecurity: No Food Insecurity (12/20/2021)   Hunger Vital Sign    Worried About Running Out of Food in the Last Year: Never true    Ran Out of Food in the Last Year: Never true  Transportation Needs: No Transportation Needs (12/20/2021)   PRAPARE - Administrator, Civil Service (Medical): No    Lack of Transportation (Non-Medical): No  Physical Activity: Sufficiently Active (12/20/2021)   Exercise Vital Sign    Days of Exercise per Week: 5 days    Minutes of Exercise per Session: 60 min  Stress: No Stress Concern Present (12/20/2021)   [employer] of Occupational Health - Occupational Stress Questionnaire    Feeling of Stress : Not at all  Social Connections: Moderately Integrated (12/20/2021)   Social Connection and Isolation Panel [NHANES]    Frequency of Communication with Friends and Family: More than three times a week    Frequency of Social Gatherings with Friends and Family: More than three times a week    Attends Religious Services: More than 4 times per year    Active Member of [employer] or Organizations: Not on file    Attends Banker Meetings: Never    Marital Status: Married    Objective:  BP 106/60   Pulse 84   Temp (!) 97.3 F (36.3 C)   Resp 14   Ht 5\' 2"  (1.575 m)   Wt 181 lb (82.1 kg)   BMI 33.11 kg/m      10/04/2022    7:20 AM 08/22/2022    1:09 PM 04/26/2022    9:22 AM  BP/Weight  Systolic BP 106 112 116  Diastolic BP 60 72 72  Wt. (Lbs) 181 180.6 187.8  BMI 33.11 kg/m2 33.03 kg/m2 34.35 kg/m2    Physical Exam Constitutional:      Appearance: Normal appearance.  HENT:     Right Ear: Tympanic membrane normal.     Left Ear: Ear canal normal.     Nose: Congestion and rhinorrhea present.     Mouth/Throat:     Pharynx: Oropharynx is clear.  Cardiovascular:     Rate and Rhythm: Normal rate and regular rhythm.     Heart sounds: Normal heart sounds.  Pulmonary:     Breath sounds: Normal breath sounds.   Neurological:     Mental Status: She is alert.    Diabetic Foot Exam - Simple   Simple Foot Form Diabetic Foot exam was performed with the following findings: Yes 10/04/2022  8:00 AM  Visual Inspection See comments: Yes Sensation Testing Intact to touch and monofilament testing bilaterally: Yes Pulse Check Posterior Tibialis and Dorsalis pulse intact bilaterally: Yes Comments Dry flaking skin noted on the bottom but no open sores or progressing wounds. Patient admits to monitoring her feet often and is using moisturizer for the flaking and dryness.       Lab Results  Component Value Date   WBC 4.5 10/04/2022   HGB 11.9 10/04/2022   HCT 36.4 10/04/2022   PLT 206 10/04/2022   GLUCOSE 96 03/28/2022   CHOL 156 03/28/2022   TRIG 346 (H) 03/28/2022   HDL 37 (L) 03/28/2022   LDLCALC 65 03/28/2022   ALT 29 03/28/2022   AST 24 03/28/2022   NA 140 03/28/2022   K 3.8 03/28/2022   CL 103 03/28/2022   CREATININE 0.73 03/28/2022   BUN 10 03/28/2022   CO2 23 03/28/2022   TSH 1.010  12/20/2021   HGBA1C 4.6 (L) 10/04/2022   MICROALBUR 80 01/13/2021      Assessment & Plan:    Mixed hyperlipidemia Assessment & Plan: Well controlled.  Continue to work on eating a healthy diet and exercise.  No major side effects reported, and no issues with compliance. The current medical regimen is effective;  continue present plan with Zetia 10mg , Lovaza 2g, Crestor 5mg    Gastroesophageal reflux disease, unspecified whether esophagitis present Assessment & Plan: Well controlled.  Continue to work on eating a healthy diet and exercise.  No major side effects reported, and no issues with compliance. The current medical regimen is effective;  continue present plan with Omeprazole 40mg     DM type 2 with diabetic mixed hyperlipidemia (HCC) -     CBC with Differential/Platelet -     Hemoglobin A1c  Acute bacterial sinusitis -     Azithromycin; 2 DAILY FOR FIRST DAY, THEN DECREASE TO ONE  DAILY FOR 4 MORE DAYS.  Dispense: 6 tablet; Refill: 0     Meds ordered this encounter  Medications   azithromycin (ZITHROMAX) 250 MG tablet    Sig: 2 DAILY FOR FIRST DAY, THEN DECREASE TO ONE DAILY FOR 4 MORE DAYS.    Dispense:  6 tablet    Refill:  0    Orders Placed This Encounter  Procedures   CBC with Differential/Platelet   Hemoglobin A1c     Follow-up: Return in about 3 months (around 01/03/2023) for fasting, Chronic, Huston Foley.   I,Carolyn M Morrison,acting as a Neurosurgeon for [employer], PA.,have documented all relevant documentation on the behalf of Langley Gauss, PA,as directed by  Langley Gauss, PA while in the presence of Langley Gauss, [place].   An After Visit Summary was printed and given to the patient.  Langley Gauss, [place] [facility] [phone]

## 2022-10-04 NOTE — Patient Instructions (Addendum)
-   symptoms and exam c/w sinusitis   - no evidence of AOM, CAP, strep pharyngitis, or other infection - given duration of symptoms, suspect bacterial etiology - will treat with azithromycin 250mg  x7d - discussed symptomatic management (flonase, decongestants, etc), natural course, and return precautions

## 2022-10-10 NOTE — Assessment & Plan Note (Signed)
Well controlled.  Continue to work on eating a healthy diet and exercise.  No major side effects reported, and no issues with compliance. The current medical regimen is effective;  continue present plan with Zetia 10mg , Lovaza 2g, Crestor 5mg 

## 2022-10-10 NOTE — Assessment & Plan Note (Signed)
Well controlled.  Continue to work on eating a healthy diet and exercise.  No major side effects reported, and no issues with compliance. The current medical regimen is effective;  continue present plan with Omeprazole 40mg   

## 2022-10-11 ENCOUNTER — Encounter: Payer: Self-pay | Admitting: Physician Assistant

## 2022-11-02 ENCOUNTER — Other Ambulatory Visit: Payer: Self-pay

## 2022-11-02 DIAGNOSIS — G4700 Insomnia, unspecified: Secondary | ICD-10-CM

## 2022-11-02 MED ORDER — ZOLPIDEM TARTRATE 10 MG PO TABS: ORAL_TABLET | ORAL | 0 refills | Status: DC

## 2022-11-04 ENCOUNTER — Ambulatory Visit: Payer: 59 | Admitting: Nurse Practitioner

## 2022-12-03 ENCOUNTER — Other Ambulatory Visit: Payer: Self-pay

## 2022-12-03 DIAGNOSIS — G4700 Insomnia, unspecified: Secondary | ICD-10-CM

## 2022-12-05 MED ORDER — ZOLPIDEM TARTRATE 10 MG PO TABS: ORAL_TABLET | ORAL | 0 refills | Status: DC

## 2022-12-14 LAB — NMR, LIPOPROFILE
Cholesterol, Total: 142 mg/dL (ref 100–199)
HDL Particle Number: 33.6 umol/L (ref >=30.5)
HDL-C: 36 mg/dL — ABNORMAL LOW (ref >39)
LDL Particle Number: 852 nmol/L (ref <1000)
LDL Size: 19.9 nm — ABNORMAL LOW (ref >20.5)
LDL-C (NIH Calc): 62 mg/dL (ref 0–99)
LP-IR Score: 75 — ABNORMAL HIGH (ref <=45)
Small LDL Particle Number: 577 nmol/L — ABNORMAL HIGH (ref <=527)
Triglycerides: 276 mg/dL — ABNORMAL HIGH (ref 0–149)

## 2022-12-19 ENCOUNTER — Other Ambulatory Visit: Payer: Self-pay

## 2022-12-19 DIAGNOSIS — G4700 Insomnia, unspecified: Secondary | ICD-10-CM

## 2022-12-23 ENCOUNTER — Ambulatory Visit: Payer: 59 | Admitting: Internal Medicine

## 2022-12-23 ENCOUNTER — Ambulatory Visit: Payer: 59 | Attending: Internal Medicine | Admitting: Internal Medicine

## 2022-12-23 ENCOUNTER — Encounter: Payer: Self-pay | Admitting: Internal Medicine

## 2022-12-23 VITALS — BP 116/60 | HR 83 | Ht 63.0 in | Wt 181.6 lb

## 2022-12-23 DIAGNOSIS — E7841 Elevated Lipoprotein(a): Secondary | ICD-10-CM | POA: Diagnosis not present

## 2022-12-23 DIAGNOSIS — E782 Mixed hyperlipidemia: Secondary | ICD-10-CM | POA: Diagnosis not present

## 2022-12-23 NOTE — Progress Notes (Signed)
LIPID CLINIC CONSULT NOTE   Chief Complaint:  Follow-up high triglycerides  Primary Care Physician: Langley Gauss, [place]  Primary Cardiologist:  None  HPI:  Madison Carey is a 52 y.o. female who is being seen today for the evaluation of high triglycerides at the request of Langley Gauss, [place]. This is a pleasant 52 year old female kindly referred for evaluation management of high triglycerides.  She reports a longstanding history of high triglycerides in the past as high as 600.  She has been on high-dose rosuvastatin 20 mg daily and Lovaza 2 g twice daily.  She is also recently lost about 30 pounds with Ozempic and changes in her diet but her triglycerides remain elevated.  Recent lipids in October showed total cholesterol 156, triglycerides 346, HDL 37 and LDL 65.  Her father had a strong family history of heart disease with an MI in his 73s or 47s and ultimately died at age 23.  She said that he had "protein in his heart".  She said she had also been tested that previously with a comprehensive blood panel, possibly a [place] heart test that suggested she might have "proteins".  She is going to try to see if she can find this prior test information however it is concerning that there is early onset heart disease suggesting that her lipids may be pathogenic.  She had not previously been on a fibrate although there is little evidence of fibrate to reduce cardiovascular risk.  08/22/2022  Madison Carey returns today for follow-up. She has had further improvement in her lipids on ezetimibe.  She tells me that she was not taking rosuvastatin as we had suggested, but despite this her cholesterol is lower.  LDL particle number now 1088, LDL-C is 87, HDL-C was 37 and triglycerides are 316.  She also is not taking her Lovaza.  12/23/2022  Madison Carey is seen today in follow-up. She seems to be doing well on her current lipid regimen. Her cholesterol has come down further, now on low dose rosuvastatin in  addition to Lovaza and ezetimibe. LDL-p now 852, LDL-C is 62, trigs are the lowest recently at 276.  She is having some right hip pain, but that is not clearly related to the statin in my mind.   PMHx:  Past Medical History:  Diagnosis Date   Adjustment insomnia    Diabetes mellitus without complication (HCC)    GERD (gastroesophageal reflux disease)    Hypertension    Impaired fasting glucose    Mixed hyperlipidemia    Other vitamin B12 deficiency anemias    Plantar fascial fibromatosis    Right arm fracture     Past Surgical History:  Procedure Laterality Date   ABDOMINAL HYSTERECTOMY     bladder tacking      CHOLECYSTECTOMY     crevical ablation      CYSTOSCOPY N/A 02/16/2016   Procedure: CYSTOSCOPY;  Surgeon: Alfredo Martinez, MD;  Location: [facility];  Service: Urology;  Laterality: N/A;   REVISION URINARY SLING N/A 02/16/2016   Procedure: REMOVAL URINARY SLING;  Surgeon: Alfredo Martinez, MD;  Location: [facility];  Service: Urology;  Laterality: N/A;    FAMHx:  Family History  Problem Relation Age of Onset   COPD Mother    Asthma Mother    Heart disease Father    Hyperlipidemia Father    Diabetes Father    Colon cancer Neg Hx    Pancreatic cancer Neg Hx    Stomach cancer Neg Hx  Liver disease Neg Hx     SOCHx:   reports that she quit smoking about 4 years ago. Her smoking use included cigarettes. She started smoking about 9 years ago. She has a 1.3 pack-year smoking history. She has never used smokeless tobacco. She reports current alcohol use of about 2.0 standard drinks of alcohol per week. She reports that she does not use drugs.  ALLERGIES:  Allergies  Allergen Reactions   Codeine Itching   Sulfa Antibiotics Other (See Comments)    ROS: Pertinent items noted in HPI and remainder of comprehensive ROS otherwise negative.  HOME MEDS: Current Outpatient Medications on File Prior to Visit  Medication Sig Dispense Refill   baclofen (LIORESAL) 10 MG tablet Take  1 tablet (10 mg total) by mouth 3 (three) times daily. 30 each 2   cyclobenzaprine (FLEXERIL) 10 MG tablet Take one tablet by mouth daily. 90 tablet 1   ezetimibe (ZETIA) 10 MG tablet Take 1 tablet (10 mg total) by mouth daily. 90 tablet 3   ibuprofen (ADVIL) 800 MG tablet Take 1 tablet (800 mg total) by mouth every 8 (eight) hours as needed. 90 tablet 0   omega-3 acid ethyl esters (LOVAZA) 1 g capsule Take 2 capsules (2 g total) by mouth 2 (two) times daily. 360 capsule 3   omeprazole (PRILOSEC) 40 MG capsule Take 1 capsule (40 mg total) by mouth every morning. 90 capsule 2   rosuvastatin (CRESTOR) 5 MG tablet Take 1 tablet (5 mg total) by mouth daily. 90 tablet 3   Semaglutide, 2 MG/DOSE, (OZEMPIC, 2 MG/DOSE,) 8 MG/3ML SOPN INJECT 2 mg AS DIRECTED ONCE A WEEK 3 mL 3   zolpidem (AMBIEN) 10 MG tablet TAKE 1 TABLET BY MOUTH NIGHTLY AT BEDTIME AS NEEDED 30 tablet 0   No current facility-administered medications on file prior to visit.    LABS/IMAGING: No results found for this or any previous visit (from the past 48 hour(s)). No results found.  LIPID PANEL:    Component Value Date/Time   CHOL 156 03/28/2022 0751   TRIG 346 (H) 03/28/2022 0751   HDL 37 (L) 03/28/2022 0751   CHOLHDL 4.2 03/28/2022 0751   LDLCALC 65 03/28/2022 0751    WEIGHTS: Wt Readings from Last 3 Encounters:  12/23/22 181 lb 9.6 oz (82.4 kg)  10/04/22 181 lb (82.1 kg)  08/22/22 180 lb 9.6 oz (81.9 kg)    VITALS: BP 116/60   Pulse 83   Ht 5\' 3"  (1.6 m)   Wt 181 lb 9.6 oz (82.4 kg)   SpO2 97%   BMI 32.17 kg/m   EXAM: Deferred  EKG: Deferred  ASSESSMENT: Mixed dyslipidemia with high triglycerides Elevated LP(a) of 251.4 nmol/L. Strong family history of early onset heart disease in her father  PLAN: 1.   Madison Carey has had further improvement in her cholesterol - LDL now 32, with particle number down to 852. Trigs are better as well. Unfortunately, she was denied Repatha and still has an elevated  LP(a).  For now, would continue current treatments. Plan follow-up with me in 6 months with repeat lipids.  Chrystie Nose, MD, [facility], FACP  [place]  [facility]  Medical Director of the Advanced Lipid Disorders &  Cardiovascular Risk Reduction Clinic Diplomate of the American Board of Clinical Lipidology Attending Cardiologist  Direct Dial: [phone]  Fax: [phone]  Website:  [email or web address] Nicely Gervis Gaba 12/23/2022, 8:13 AM

## 2022-12-23 NOTE — Patient Instructions (Signed)
Medication Instructions:  NO CHANGES  *If you need a refill on your cardiac medications before your next appointment, please call your pharmacy*   Lab Work: FASTING NMR lipoprofile in 6 months ** complete about 1 week before your next visit  If you have labs (blood work) drawn today and your tests are completely normal, you will receive your results only by: MyChart Message (if you have MyChart) OR A paper copy in the mail If you have any lab test that is abnormal or we need to change your treatment, we will call you to review the results.   Follow-Up: At [facility], you and your health needs are our priority.  As part of our continuing mission to provide you with exceptional heart care, we have created designated Provider Care Teams.  These Care Teams include your primary Cardiologist (physician) and Advanced Practice Providers (APPs -  Physician Assistants and Nurse Practitioners) who all work together to provide you with the care you need, when you need it.  We recommend signing up for the patient portal called "MyChart".  Sign up information is provided on this After Visit Summary.  MyChart is used to connect with patients for Virtual Visits (Telemedicine).  Patients are able to view lab/test results, encounter notes, upcoming appointments, etc.  Non-urgent messages can be sent to your provider as well.   To learn more about what you can do with MyChart, go to [email or web address].    Your next appointment:    6 months with Dr. Rennis Golden  ** call in September or October for next visit

## 2022-12-29 MED ORDER — ZOLPIDEM TARTRATE 10 MG PO TABS: ORAL_TABLET | ORAL | 0 refills | Status: DC

## 2023-01-02 ENCOUNTER — Other Ambulatory Visit: Payer: Self-pay | Admitting: Family Medicine

## 2023-01-02 DIAGNOSIS — E88819 Insulin resistance, unspecified: Secondary | ICD-10-CM

## 2023-01-02 DIAGNOSIS — Z6835 Body mass index (BMI) 35.0-35.9, adult: Secondary | ICD-10-CM

## 2023-01-02 DIAGNOSIS — Z833 Family history of diabetes mellitus: Secondary | ICD-10-CM

## 2023-01-02 DIAGNOSIS — E1169 Type 2 diabetes mellitus with other specified complication: Secondary | ICD-10-CM

## 2023-01-02 NOTE — Progress Notes (Unsigned)
Subjective:  Patient ID: Madison Carey, female    DOB: [birth year]  Age: 52 y.o. MRN: [id number]  Chief Complaint  Patient presents with   Medical Management of Chronic Issues    HPI  Hyperlipidemia:  Zetia 10 mg daily, lovaza 2 grams twice daily and crestor 5 mg daily.  Diabetes:  Ozempic 2 mg weekly.  GERD:  Omeprazole 40 mg daily.         12/20/2021    7:46 AM 09/17/2021    9:24 AM 05/03/2021    8:30 AM 01/13/2021    7:56 AM 07/16/2020    8:16 AM  Depression screen PHQ 2/9  Decreased Interest 0 0 0 0   Down, Depressed, Hopeless 0 0 0 0 0  PHQ - 2 Score 0 0 0 0 0  Altered sleeping 0    0  Tired, decreased energy 0    0  Change in appetite 0    0  Feeling bad or failure about yourself  0    0  Trouble concentrating 0    0  Moving slowly or fidgety/restless 0    0  Suicidal thoughts 0    0  PHQ-9 Score 0    0  Difficult doing work/chores     Not difficult at all        12/20/2021    7:46 AM  Fall Risk   Falls in the past year? 0  Number falls in past yr: 0  Injury with Fall? 0  Risk for fall due to : No Fall Risks  Follow up Falls evaluation completed    Patient Care Team: Langley Gauss, [place] as PCP - General (Physician Assistant)   Review of Systems  Current Outpatient Medications on File Prior to Visit  Medication Sig Dispense Refill   baclofen (LIORESAL) 10 MG tablet Take 1 tablet (10 mg total) by mouth 3 (three) times daily. 30 each 2   cyclobenzaprine (FLEXERIL) 10 MG tablet Take one tablet by mouth daily. 90 tablet 1   ezetimibe (ZETIA) 10 MG tablet Take 1 tablet (10 mg total) by mouth daily. 90 tablet 3   ibuprofen (ADVIL) 800 MG tablet Take 1 tablet (800 mg total) by mouth every 8 (eight) hours as needed. 90 tablet 0   omega-3 acid ethyl esters (LOVAZA) 1 g capsule Take 2 capsules (2 g total) by mouth 2 (two) times daily. 360 capsule 3   omeprazole (PRILOSEC) 40 MG capsule Take 1 capsule (40 mg total) by mouth every morning. 90 capsule 2    rosuvastatin (CRESTOR) 5 MG tablet Take 1 tablet (5 mg total) by mouth daily. 90 tablet 3   Semaglutide, 2 MG/DOSE, (OZEMPIC, 2 MG/DOSE,) 8 MG/3ML SOPN INJECT 2 MG UNDER THE SKIN ONCE A WEEK 3 mL 3   zolpidem (AMBIEN) 10 MG tablet TAKE 1 TABLET BY MOUTH NIGHTLY AT BEDTIME AS NEEDED 30 tablet 0   No current facility-administered medications on file prior to visit.   Past Medical History:  Diagnosis Date   Adjustment insomnia    Diabetes mellitus without complication (HCC)    GERD (gastroesophageal reflux disease)    Hypertension    Impaired fasting glucose    Mixed hyperlipidemia    Other vitamin B12 deficiency anemias    Plantar fascial fibromatosis    Right arm fracture    Past Surgical History:  Procedure Laterality Date   ABDOMINAL HYSTERECTOMY     bladder tacking      CHOLECYSTECTOMY  crevical ablation      CYSTOSCOPY N/A 02/16/2016   Procedure: CYSTOSCOPY;  Surgeon: Alfredo Martinez, MD;  Location: [facility];  Service: Urology;  Laterality: N/A;   REVISION URINARY SLING N/A 02/16/2016   Procedure: REMOVAL URINARY SLING;  Surgeon: Alfredo Martinez, MD;  Location: [facility];  Service: Urology;  Laterality: N/A;    Family History  Problem Relation Age of Onset   COPD Mother    Asthma Mother    Heart disease Father    Hyperlipidemia Father    Diabetes Father    Colon cancer Neg Hx    Pancreatic cancer Neg Hx    Stomach cancer Neg Hx    Liver disease Neg Hx    Social History   Socioeconomic History   Marital status: Married    Spouse name: Not on file   Number of children: 3   Years of education: Not on file   Highest education level: Not on file  Occupational History   Occupation: Solicitor  Tobacco Use   Smoking status: Former    Current packs/day: 0.00    Average packs/day: 0.3 packs/day for 5.0 years (1.3 ttl pk-yrs)    Types: Cigarettes    Start date: 2015    Quit date: 2020    Years since quitting: 4.5   Smokeless tobacco: Never  Vaping Use   Vaping  status: Every Day   Substances: Nicotine, Flavoring  Substance and Sexual Activity   Alcohol use: Yes    Alcohol/week: 2.0 standard drinks of alcohol    Types: 2 Glasses of wine per week    Comment: occasionally   Drug use: No   Sexual activity: Yes    Partners: Male  Other Topics Concern   Not on file  Social History Narrative   Not on file   Social Determinants of Health   Financial Resource Strain: Low Risk  (12/20/2021)   Overall Financial Resource Strain (CARDIA)    Difficulty of Paying Living Expenses: Not hard at all  Food Insecurity: No Food Insecurity (12/20/2021)   Hunger Vital Sign    Worried About Running Out of Food in the Last Year: Never true    Ran Out of Food in the Last Year: Never true  Transportation Needs: No Transportation Needs (12/20/2021)   PRAPARE - Administrator, Civil Service (Medical): No    Lack of Transportation (Non-Medical): No  Physical Activity: Sufficiently Active (12/20/2021)   Exercise Vital Sign    Days of Exercise per Week: 5 days    Minutes of Exercise per Session: 60 min  Stress: No Stress Concern Present (12/20/2021)   [employer] of Occupational Health - Occupational Stress Questionnaire    Feeling of Stress : Not at all  Social Connections: Moderately Integrated (12/20/2021)   Social Connection and Isolation Panel [NHANES]    Frequency of Communication with Friends and Family: More than three times a week    Frequency of Social Gatherings with Friends and Family: More than three times a week    Attends Religious Services: More than 4 times per year    Active Member of [employer] or Organizations: Not on file    Attends Banker Meetings: Never    Marital Status: Married    Objective:  There were no vitals taken for this visit.     12/23/2022    8:10 AM 10/04/2022    7:20 AM 08/22/2022    1:09 PM  BP/Weight  Systolic BP 116 106  112  Diastolic BP 60 60 72  Wt. (Lbs) 181.6 181 180.6  BMI 32.17 kg/m2  33.11 kg/m2 33.03 kg/m2    Physical Exam  Diabetic Foot Exam - Simple   No data filed      Lab Results  Component Value Date   WBC 4.5 10/04/2022   HGB 11.9 10/04/2022   HCT 36.4 10/04/2022   PLT 206 10/04/2022   GLUCOSE 96 03/28/2022   CHOL 156 03/28/2022   TRIG 346 (H) 03/28/2022   HDL 37 (L) 03/28/2022   LDLCALC 65 03/28/2022   ALT 29 03/28/2022   AST 24 03/28/2022   NA 140 03/28/2022   K 3.8 03/28/2022   CL 103 03/28/2022   CREATININE 0.73 03/28/2022   BUN 10 03/28/2022   CO2 23 03/28/2022   TSH 1.010 12/20/2021   HGBA1C 4.6 (L) 10/04/2022   MICROALBUR 80 01/13/2021      Assessment & Plan:    Mixed hyperlipidemia  Gastroesophageal reflux disease, unspecified whether esophagitis present     No orders of the defined types were placed in this encounter.   No orders of the defined types were placed in this encounter.    Follow-up: No follow-ups on file.   I,Marla I Leal-Borjas,acting as a scribe for [employer], PA.,have documented all relevant documentation on the behalf of Langley Gauss, PA,as directed by  Langley Gauss, PA while in the presence of Langley Gauss, [place].   An After Visit Summary was printed and given to the patient.  Langley Gauss, [place] [facility] [phone]

## 2023-01-05 ENCOUNTER — Encounter: Payer: Self-pay | Admitting: Physician Assistant

## 2023-01-05 ENCOUNTER — Ambulatory Visit: Payer: 59 | Admitting: Physician Assistant

## 2023-01-05 VITALS — BP 112/62 | HR 91 | Temp 97.6°F | Ht 62.0 in | Wt 177.0 lb

## 2023-01-05 DIAGNOSIS — M25551 Pain in right hip: Secondary | ICD-10-CM

## 2023-01-05 DIAGNOSIS — E1169 Type 2 diabetes mellitus with other specified complication: Secondary | ICD-10-CM

## 2023-01-05 DIAGNOSIS — E782 Mixed hyperlipidemia: Secondary | ICD-10-CM | POA: Diagnosis not present

## 2023-01-05 DIAGNOSIS — M542 Cervicalgia: Secondary | ICD-10-CM

## 2023-01-05 DIAGNOSIS — E889 Metabolic disorder, unspecified: Secondary | ICD-10-CM

## 2023-01-05 DIAGNOSIS — G4700 Insomnia, unspecified: Secondary | ICD-10-CM

## 2023-01-05 DIAGNOSIS — Z833 Family history of diabetes mellitus: Secondary | ICD-10-CM

## 2023-01-05 DIAGNOSIS — K219 Gastro-esophageal reflux disease without esophagitis: Secondary | ICD-10-CM | POA: Diagnosis not present

## 2023-01-05 DIAGNOSIS — Z1231 Encounter for screening mammogram for malignant neoplasm of breast: Secondary | ICD-10-CM | POA: Insufficient documentation

## 2023-01-05 DIAGNOSIS — E88819 Insulin resistance, unspecified: Secondary | ICD-10-CM

## 2023-01-05 DIAGNOSIS — Z6835 Body mass index (BMI) 35.0-35.9, adult: Secondary | ICD-10-CM | POA: Insufficient documentation

## 2023-01-05 DIAGNOSIS — G8929 Other chronic pain: Secondary | ICD-10-CM

## 2023-01-05 MED ORDER — ZOLPIDEM TARTRATE 10 MG PO TABS
ORAL_TABLET | ORAL | 0 refills | Status: DC
Start: 2023-01-05 — End: 2023-02-15

## 2023-01-05 MED ORDER — IBUPROFEN 800 MG PO TABS
800.0000 mg | ORAL_TABLET | Freq: Three times a day (TID) | ORAL | 0 refills | Status: DC | PRN
Start: 2023-01-05 — End: 2023-07-07

## 2023-01-05 MED ORDER — ROSUVASTATIN CALCIUM 5 MG PO TABS: 5.0000 mg | ORAL_TABLET | Freq: Every day | ORAL | 3 refills | Status: DC

## 2023-01-05 MED ORDER — CYCLOBENZAPRINE HCL 10 MG PO TABS
ORAL_TABLET | ORAL | 1 refills | Status: DC
Start: 2023-01-05 — End: 2023-07-03

## 2023-01-05 MED ORDER — OMEGA-3-ACID ETHYL ESTERS 1 G PO CAPS: 2.0000 g | ORAL_CAPSULE | Freq: Two times a day (BID) | ORAL | 3 refills | Status: DC

## 2023-01-05 MED ORDER — OMEPRAZOLE 40 MG PO CPDR
40.0000 mg | DELAYED_RELEASE_CAPSULE | Freq: Every morning | ORAL | 2 refills | Status: DC
Start: 2023-01-05 — End: 2023-09-29

## 2023-01-05 MED ORDER — EZETIMIBE 10 MG PO TABS: 10.0000 mg | ORAL_TABLET | Freq: Every day | ORAL | 3 refills | Status: DC

## 2023-01-05 MED ORDER — OZEMPIC (2 MG/DOSE) 8 MG/3ML ~~LOC~~ SOPN
PEN_INJECTOR | SUBCUTANEOUS | 3 refills | Status: DC
Start: 2023-01-05 — End: 2023-05-28

## 2023-01-05 NOTE — Assessment & Plan Note (Signed)
Controlled Continue taking the Omeprazole 40mg  as directed

## 2023-01-05 NOTE — Assessment & Plan Note (Signed)
Controlled Continue to follow up with Dr. Rennis Golden Continue taking Zetia 10mg , Lovaza 2 g, Crestor 5mg 

## 2023-01-05 NOTE — Assessment & Plan Note (Signed)
Controlled Continue taking the Ambien 10mg  as directed

## 2023-01-05 NOTE — Assessment & Plan Note (Signed)
Sent for X-ray Will refer to orthopedic if fracture present Will refer to PT if normal

## 2023-01-05 NOTE — Assessment & Plan Note (Signed)
Controlled Continue taking the Ozempic 2mg  as directed

## 2023-01-10 ENCOUNTER — Other Ambulatory Visit: Payer: Self-pay

## 2023-01-10 DIAGNOSIS — Z1231 Encounter for screening mammogram for malignant neoplasm of breast: Secondary | ICD-10-CM

## 2023-01-12 ENCOUNTER — Encounter: Payer: Self-pay | Admitting: Physician Assistant

## 2023-02-15 ENCOUNTER — Other Ambulatory Visit: Payer: Self-pay

## 2023-02-15 DIAGNOSIS — G4700 Insomnia, unspecified: Secondary | ICD-10-CM

## 2023-02-15 MED ORDER — ZOLPIDEM TARTRATE 10 MG PO TABS
ORAL_TABLET | ORAL | 0 refills | Status: DC
Start: 2023-02-15 — End: 2023-04-17

## 2023-03-01 ENCOUNTER — Telehealth: Payer: Self-pay

## 2023-03-01 NOTE — Telephone Encounter (Signed)
Patient left a message requesting labs results and hip xray results (01/2023). Left voicemail with these results, patient to call our office.

## 2023-03-16 LAB — HM MAMMOGRAPHY

## 2023-03-20 ENCOUNTER — Encounter: Payer: Self-pay | Admitting: Physician Assistant

## 2023-04-17 ENCOUNTER — Other Ambulatory Visit: Payer: Self-pay | Admitting: Physician Assistant

## 2023-04-17 DIAGNOSIS — G4700 Insomnia, unspecified: Secondary | ICD-10-CM

## 2023-05-16 ENCOUNTER — Other Ambulatory Visit: Payer: Self-pay

## 2023-05-16 DIAGNOSIS — G4700 Insomnia, unspecified: Secondary | ICD-10-CM

## 2023-05-16 MED ORDER — ZOLPIDEM TARTRATE 10 MG PO TABS
ORAL_TABLET | ORAL | 1 refills | Status: DC
Start: 2023-05-16 — End: 2023-06-14

## 2023-05-28 ENCOUNTER — Other Ambulatory Visit: Payer: Self-pay | Admitting: Physician Assistant

## 2023-05-28 DIAGNOSIS — E1169 Type 2 diabetes mellitus with other specified complication: Secondary | ICD-10-CM

## 2023-05-28 DIAGNOSIS — E88819 Insulin resistance, unspecified: Secondary | ICD-10-CM

## 2023-05-28 DIAGNOSIS — E889 Metabolic disorder, unspecified: Secondary | ICD-10-CM

## 2023-05-28 DIAGNOSIS — Z6835 Body mass index (BMI) 35.0-35.9, adult: Secondary | ICD-10-CM

## 2023-05-28 DIAGNOSIS — Z833 Family history of diabetes mellitus: Secondary | ICD-10-CM

## 2023-06-14 ENCOUNTER — Other Ambulatory Visit: Payer: Self-pay

## 2023-06-14 DIAGNOSIS — G4700 Insomnia, unspecified: Secondary | ICD-10-CM

## 2023-06-14 MED ORDER — ZOLPIDEM TARTRATE 10 MG PO TABS
ORAL_TABLET | ORAL | 1 refills | Status: DC
Start: 2023-06-14 — End: 2023-09-18

## 2023-07-03 ENCOUNTER — Other Ambulatory Visit: Payer: Self-pay

## 2023-07-03 DIAGNOSIS — M542 Cervicalgia: Secondary | ICD-10-CM

## 2023-07-04 MED ORDER — CYCLOBENZAPRINE HCL 10 MG PO TABS
ORAL_TABLET | ORAL | 1 refills | Status: DC
Start: 2023-07-04 — End: 2024-01-04

## 2023-07-06 NOTE — Progress Notes (Signed)
Subjective:  Patient ID: Madison Carey, female    DOB: 1971-04-14  Age: 53 y.o. MRN: [id number]  Chief Complaint  Patient presents with   Medical Management of Chronic Issues    HPI   Hyperlipidemia:  Zetia 10 mg daily, lovaza 2 grams twice daily and crestor 5 mg daily.   Diabetes:  Ozempic 2 mg weekly.    GERD:  Omeprazole 40 mg daily.     Discussed the use of AI scribe software for clinical note transcription with the patient, who gave verbal consent to proceed.  History of Present Illness   A 53 year old female with a history of arthritis and a full hysterectomy presents with hair loss, hip pain, and neck pain. She reports significant hair loss, particularly when washing her hair, and is unsure if this is a side effect of her current medications, Ozempic and ezetimibe. She has been experiencing hip pain, which is deep and worsens upon standing up. The pain is so severe that it affects her sleep as she cannot sleep on the affected side. She also reports constant neck pain, which limits her range of motion and does not improve with massages or Flexeril. She has previously seen an orthopedist for her neck pain, who suggested injections, but she is hesitant due to a previous negative experience with a shoulder injection. She has not done physical therapy for either her hip or neck pain.            07/07/2023    7:32 AM  Fall Risk   Falls in the past year? 0  Number falls in past yr: 0  Injury with Fall? 0  Risk for fall due to : No Fall Risks  Follow up Falls evaluation completed    Patient Care Team: Langley Gauss, [place] as PCP - General (Physician Assistant)   Review of Systems  Constitutional:  Negative for appetite change, fatigue and fever.  HENT:  Negative for congestion, ear pain, sinus pressure and sore throat.   Respiratory:  Negative for cough, chest tightness, shortness of breath and wheezing.   Cardiovascular:  Negative for chest pain and palpitations.   Gastrointestinal:  Negative for abdominal pain, constipation, diarrhea, nausea and vomiting.  Genitourinary:  Negative for dysuria and hematuria.  Musculoskeletal:  Negative for arthralgias, back pain, joint swelling and myalgias.  Skin:  Negative for rash.  Neurological:  Negative for dizziness, weakness and headaches.  Psychiatric/Behavioral:  Negative for dysphoric mood. The patient is not nervous/anxious.     Current Outpatient Medications on File Prior to Visit  Medication Sig Dispense Refill   cyclobenzaprine (FLEXERIL) 10 MG tablet Take one tablet by mouth daily. 90 tablet 1   ezetimibe (ZETIA) 10 MG tablet Take 1 tablet (10 mg total) by mouth daily. 90 tablet 3   omega-3 acid ethyl esters (LOVAZA) 1 g capsule Take 2 capsules (2 g total) by mouth 2 (two) times daily. 360 capsule 3   omeprazole (PRILOSEC) 40 MG capsule Take 1 capsule (40 mg total) by mouth every morning. 90 capsule 2   OZEMPIC, 2 MG/DOSE, 8 MG/3ML SOPN INJECT 2 MG UNDER THE SKIN ONCE A WEEK 3 mL 3   rosuvastatin (CRESTOR) 5 MG tablet Take 1 tablet (5 mg total) by mouth daily. 90 tablet 3   zolpidem (AMBIEN) 10 MG tablet TAKE ONE TABLET BY MOUTH NIGHTLY AT BEDTIME AS NEEDED 30 tablet 1   No current facility-administered medications on file prior to visit.   Past Medical History:  Diagnosis  Date   Adjustment insomnia    Diabetes mellitus without complication (HCC)    GERD (gastroesophageal reflux disease)    Hypertension    Impaired fasting glucose    Mixed hyperlipidemia    Other vitamin B12 deficiency anemias    Plantar fascial fibromatosis    Right arm fracture    Past Surgical History:  Procedure Laterality Date   ABDOMINAL HYSTERECTOMY     bladder tacking      CHOLECYSTECTOMY     crevical ablation      CYSTOSCOPY N/A 02/16/2016   Procedure: CYSTOSCOPY;  Surgeon: Alfredo Martinez, MD;  Location: [facility];  Service: Urology;  Laterality: N/A;   REVISION URINARY SLING N/A 02/16/2016   Procedure: REMOVAL  URINARY SLING;  Surgeon: Alfredo Martinez, MD;  Location: [facility];  Service: Urology;  Laterality: N/A;    Family History  Problem Relation Age of Onset   COPD Mother    Asthma Mother    Heart disease Father    Hyperlipidemia Father    Diabetes Father    Colon cancer Neg Hx    Pancreatic cancer Neg Hx    Stomach cancer Neg Hx    Liver disease Neg Hx    Social History   Socioeconomic History   Marital status: Married    Spouse name: Not on file   Number of children: 3   Years of education: Not on file   Highest education level: Not on file  Occupational History   Occupation: Solicitor  Tobacco Use   Smoking status: Former    Current packs/day: 0.00    Average packs/day: 0.3 packs/day for 5.0 years (1.3 ttl pk-yrs)    Types: Cigarettes    Start date: 2015    Quit date: 2020    Years since quitting: 5.0   Smokeless tobacco: Never  Vaping Use   Vaping status: Every Day   Substances: Nicotine, Flavoring  Substance and Sexual Activity   Alcohol use: Yes    Alcohol/week: 2.0 standard drinks of alcohol    Types: 2 Glasses of wine per week    Comment: occasionally   Drug use: No   Sexual activity: Yes    Partners: Male  Other Topics Concern   Not on file  Social History Narrative   Not on file   Social Drivers of Health   Financial Resource Strain: Low Risk  (07/03/2023)   Overall Financial Resource Strain (CARDIA)    Difficulty of Paying Living Expenses: Not very hard  Food Insecurity: No Food Insecurity (07/03/2023)   Hunger Vital Sign    Worried About Running Out of Food in the Last Year: Never true    Ran Out of Food in the Last Year: Never true  Transportation Needs: No Transportation Needs (07/03/2023)   PRAPARE - Administrator, Civil Service (Medical): No    Lack of Transportation (Non-Medical): No  Physical Activity: Insufficiently Active (07/03/2023)   Exercise Vital Sign    Days of Exercise per Week: 3 days    Minutes of Exercise per  Session: 40 min  Stress: Stress Concern Present (07/03/2023)   [employer] of Occupational Health - Occupational Stress Questionnaire    Feeling of Stress : To some extent  Social Connections: Socially Integrated (07/03/2023)   Social Connection and Isolation Panel [NHANES]    Frequency of Communication with Friends and Family: More than three times a week    Frequency of Social Gatherings with Friends and Family: More than three  times a week    Attends Religious Services: More than 4 times per year    Active Member of Clubs or Organizations: Yes    Attends Engineer, structural: More than 4 times per year    Marital Status: Married    Objective:  BP 102/60 (BP Location: Right Arm, Patient Position: Sitting, Cuff Size: Large)   Pulse 67   Temp 97.7 F (36.5 C) (Temporal)   Resp 16   Ht 5\' 2"  (1.575 m)   Wt 178 lb (80.7 kg)   SpO2 98%   BMI 32.56 kg/m      07/07/2023    7:29 AM 01/05/2023    7:36 AM 12/23/2022    8:10 AM  BP/Weight  Systolic BP 102 112 116  Diastolic BP 60 62 60  Wt. (Lbs) 178 177 181.6  BMI 32.56 kg/m2 32.37 kg/m2 32.17 kg/m2    Physical Exam Vitals reviewed.  Constitutional:      Appearance: Normal appearance.  Cardiovascular:     Rate and Rhythm: Normal rate and regular rhythm.     Heart sounds: Normal heart sounds.  Pulmonary:     Effort: Pulmonary effort is normal.     Breath sounds: Normal breath sounds.  Abdominal:     General: Bowel sounds are normal.     Palpations: Abdomen is soft.     Tenderness: There is no abdominal tenderness.  Musculoskeletal:     Right hip: Tenderness present. Decreased range of motion. Normal strength.     Left hip: Normal.  Neurological:     Mental Status: She is alert and oriented to person, place, and time.  Psychiatric:        Mood and Affect: Mood normal.        Behavior: Behavior normal.     Diabetic Foot Exam - Simple   No data filed      Lab Results  Component Value Date   WBC 4.2  01/05/2023   HGB 11.5 01/05/2023   HCT 33.7 (L) 01/05/2023   PLT 237 01/05/2023   GLUCOSE 83 01/05/2023   CHOL 156 03/28/2022   TRIG 346 (H) 03/28/2022   HDL 37 (L) 03/28/2022   LDLCALC 65 03/28/2022   ALT 18 01/05/2023   AST 18 01/05/2023   NA 141 01/05/2023   K 4.4 01/05/2023   CL 106 01/05/2023   CREATININE 0.82 01/05/2023   BUN 6 01/05/2023   CO2 24 01/05/2023   TSH 0.852 01/05/2023   HGBA1C 4.6 (L) 10/04/2022   MICROALBUR 80 01/13/2021      Assessment & Plan:    DM type 2 with diabetic mixed hyperlipidemia (HCC) Assessment & Plan: Well controlled.  Continue to work on eating a healthy diet and exercise.  Labs drawn today.   No major side effects reported, and no issues with compliance. The current medical regimen is effective;  continue present plan with Ozempic 2mg  Will adjust medication as needed depending on labs Lab Results  Component Value Date   HGBA1C 4.6 (L) 10/04/2022   HGBA1C 4.4 (L) 03/28/2022   HGBA1C 4.5 (L) 09/17/2021      Orders: -     Microalbumin / creatinine urine ratio -     Hemoglobin A1c  Mixed hyperlipidemia Assessment & Plan: Well controlled.  Continue to work on eating a healthy diet and exercise.  Labs drawn today.   No major side effects reported, and no issues with compliance. The current medical regimen is effective;  continue present plan  with Zetia 10mg , Crestor 5mg  Will adjust medication as needed depending on labs Lab Results  Component Value Date   LDLCALC 65 03/28/2022     Orders: -     CMP14+EGFR -     CBC with Differential/Platelet -     Lipid panel  Chronic hip pain, right Assessment & Plan: Deep hip pain, likely arthritis based on previous x-ray. Pain affects sleep and mobility. -Start physical therapy for hip pain. -Consider referral to orthopedist if no improvement with physical therapy.  Orders: -     Ambulatory referral to Physical Therapy  Gastroesophageal reflux disease, unspecified whether  esophagitis present Assessment & Plan: Controlled Continue to monitor for worsening symptoms Continue taking the Omeprazole 40mg  Will adjust treatment depending on symptoms   Neck pain Assessment & Plan: Chronic neck pain, limited range of motion. Previous x-ray showed arthritis. Pain not relieved by Flexeril. -Continue current management. -Consider turmeric supplement for anti-inflammatory properties.  Orders: -     Ibuprofen; Take 1 tablet (800 mg total) by mouth every 8 (eight) hours as needed.  Dispense: 90 tablet; Refill: 3  Hair loss Assessment & Plan: Unexplained hair loss, not associated with any current medications. No signs of scalp irritation or rash. -Consider collagen supplement for hair growth.      Meds ordered this encounter  Medications   ibuprofen (ADVIL) 800 MG tablet    Sig: Take 1 tablet (800 mg total) by mouth every 8 (eight) hours as needed.    Dispense:  90 tablet    Refill:  3    Orders Placed This Encounter  Procedures   CMP14+EGFR   CBC with Differential/Platelet   Lipid panel   Microalbumin / creatinine urine ratio   Hemoglobin A1c   Ambulatory referral to Physical Therapy    General Health Maintenance -Continue current medications, including ibuprofen for pain management. -Check labs and follow up in six months, or sooner if hip or neck pain worsens.     Hair Loss Unexplained hair loss, not associated with any current medications. No signs of scalp irritation or rash. -Consider collagen supplement for hair growth.  Hip Pain Deep hip pain, likely arthritis based on previous x-ray. Pain affects sleep and mobility. -Start physical therapy for hip pain. -Consider referral to orthopedist if no improvement with physical therapy.  Neck Pain Chronic neck pain, limited range of motion. Previous x-ray showed arthritis. Pain not relieved by Flexeril. -Continue current management. -Consider turmeric supplement for anti-inflammatory  properties.  General Health Maintenance -Continue current medications, including ibuprofen for pain management. -Check labs and follow up in six months, or sooner if hip or neck pain worsens.       Follow-up: No follow-ups on file.   I,Angela Taylor,acting as a Neurosurgeon for [employer], PA.,have documented all relevant documentation on the behalf of Langley Gauss, PA,as directed by  Langley Gauss, PA while in the presence of Langley Gauss, [place].   An After Visit Summary was printed and given to the patient.  Langley Gauss, [place] [facility] [phone]

## 2023-07-07 ENCOUNTER — Encounter: Payer: Self-pay | Admitting: Physician Assistant

## 2023-07-07 ENCOUNTER — Ambulatory Visit: Payer: 59 | Admitting: Physician Assistant

## 2023-07-07 VITALS — BP 102/60 | HR 67 | Temp 97.7°F | Resp 16 | Ht 62.0 in | Wt 178.0 lb

## 2023-07-07 DIAGNOSIS — E559 Vitamin D deficiency, unspecified: Secondary | ICD-10-CM | POA: Insufficient documentation

## 2023-07-07 DIAGNOSIS — R232 Flushing: Secondary | ICD-10-CM | POA: Insufficient documentation

## 2023-07-07 DIAGNOSIS — M25551 Pain in right hip: Secondary | ICD-10-CM | POA: Diagnosis not present

## 2023-07-07 DIAGNOSIS — N951 Menopausal and female climacteric states: Secondary | ICD-10-CM | POA: Insufficient documentation

## 2023-07-07 DIAGNOSIS — E1169 Type 2 diabetes mellitus with other specified complication: Secondary | ICD-10-CM | POA: Insufficient documentation

## 2023-07-07 DIAGNOSIS — K219 Gastro-esophageal reflux disease without esophagitis: Secondary | ICD-10-CM | POA: Diagnosis not present

## 2023-07-07 DIAGNOSIS — E782 Mixed hyperlipidemia: Secondary | ICD-10-CM | POA: Insufficient documentation

## 2023-07-07 DIAGNOSIS — N898 Other specified noninflammatory disorders of vagina: Secondary | ICD-10-CM | POA: Insufficient documentation

## 2023-07-07 DIAGNOSIS — G8929 Other chronic pain: Secondary | ICD-10-CM

## 2023-07-07 DIAGNOSIS — M542 Cervicalgia: Secondary | ICD-10-CM

## 2023-07-07 DIAGNOSIS — E669 Obesity, unspecified: Secondary | ICD-10-CM | POA: Insufficient documentation

## 2023-07-07 DIAGNOSIS — L659 Nonscarring hair loss, unspecified: Secondary | ICD-10-CM

## 2023-07-07 DIAGNOSIS — R6882 Decreased libido: Secondary | ICD-10-CM | POA: Insufficient documentation

## 2023-07-07 DIAGNOSIS — F419 Anxiety disorder, unspecified: Secondary | ICD-10-CM | POA: Insufficient documentation

## 2023-07-07 MED ORDER — IBUPROFEN 800 MG PO TABS: 800.0000 mg | ORAL_TABLET | Freq: Three times a day (TID) | ORAL | 3 refills | Status: AC | PRN

## 2023-07-07 NOTE — Assessment & Plan Note (Signed)
Controlled Continue to monitor for worsening symptoms Continue taking the Omeprazole 40mg  Will adjust treatment depending on symptoms

## 2023-07-07 NOTE — Assessment & Plan Note (Signed)
Chronic neck pain, limited range of motion. Previous x-ray showed arthritis. Pain not relieved by Flexeril. -Continue current management. -Consider turmeric supplement for anti-inflammatory properties.

## 2023-07-07 NOTE — Assessment & Plan Note (Signed)
Well controlled.  Continue to work on eating a healthy diet and exercise.  Labs drawn today.   No major side effects reported, and no issues with compliance. The current medical regimen is effective;  continue present plan with Ozempic 2mg  Will adjust medication as needed depending on labs Lab Results  Component Value Date   HGBA1C 4.6 (L) 10/04/2022   HGBA1C 4.4 (L) 03/28/2022   HGBA1C 4.5 (L) 09/17/2021

## 2023-07-07 NOTE — Assessment & Plan Note (Signed)
Unexplained hair loss, not associated with any current medications. No signs of scalp irritation or rash. -Consider collagen supplement for hair growth.

## 2023-07-07 NOTE — Patient Instructions (Signed)
VISIT SUMMARY:  Today, we discussed your concerns about hair loss, hip pain, and neck pain. We reviewed your symptoms and current medications, and I provided recommendations to help manage these issues.  YOUR PLAN:  -HAIR LOSS: You are experiencing significant hair loss, which does not appear to be related to your current medications. There are no signs of scalp irritation or rash. I recommend trying a collagen supplement to promote hair growth.  -HIP PAIN: Your hip pain is likely due to arthritis, as indicated by a previous x-ray. This pain is affecting your sleep and mobility. I suggest starting physical therapy to help manage the pain. If there is no improvement with physical therapy, we may consider referring you to an orthopedist.  -NECK PAIN: You have chronic neck pain with limited range of motion, likely due to arthritis as shown in a previous x-ray. The pain has not been relieved by Flexeril. I recommend continuing your current management plan and considering a turmeric supplement for its anti-inflammatory properties.  -GENERAL HEALTH MAINTENANCE: Continue taking your current medications, including ibuprofen for pain management. We will check your labs and follow up in six months, or sooner if your hip or neck pain worsens.  INSTRUCTIONS:  Please start physical therapy for your hip pain. If your hip or neck pain does not improve, we may need to refer you to an orthopedist. Continue taking your current medications and consider adding a collagen supplement for hair growth and a turmeric supplement for neck pain. We will check your labs and follow up in six months, or sooner if needed.

## 2023-07-07 NOTE — Assessment & Plan Note (Signed)
Deep hip pain, likely arthritis based on previous x-ray. Pain affects sleep and mobility. -Start physical therapy for hip pain. -Consider referral to orthopedist if no improvement with physical therapy.

## 2023-07-07 NOTE — Assessment & Plan Note (Signed)
Well controlled.  Continue to work on eating a healthy diet and exercise.  Labs drawn today.   No major side effects reported, and no issues with compliance. The current medical regimen is effective;  continue present plan with Zetia 10mg , Crestor 5mg  Will adjust medication as needed depending on labs Lab Results  Component Value Date   LDLCALC 65 03/28/2022

## 2023-07-08 LAB — CBC WITH DIFFERENTIAL/PLATELET
Basophils Absolute: 0 10*3/uL (ref 0.0–0.2)
Basos: 1 %
EOS (ABSOLUTE): 0.1 10*3/uL (ref 0.0–0.4)
Eos: 2 %
Hematocrit: 41 % (ref 34.0–46.6)
Hemoglobin: 13.7 g/dL (ref 11.1–15.9)
Immature Grans (Abs): 0 10*3/uL (ref 0.0–0.1)
Immature Granulocytes: 0 %
Lymphocytes Absolute: 1.5 10*3/uL (ref 0.7–3.1)
Lymphs: 27 %
MCH: 30.6 pg (ref 26.6–33.0)
MCHC: 33.4 g/dL (ref 31.5–35.7)
MCV: 92 fL (ref 79–97)
Monocytes Absolute: 0.5 10*3/uL (ref 0.1–0.9)
Monocytes: 8 %
Neutrophils Absolute: 3.5 10*3/uL (ref 1.4–7.0)
Neutrophils: 62 %
Platelets: 269 10*3/uL (ref 150–450)
RBC: 4.47 x10E6/uL (ref 3.77–5.28)
RDW: 13.3 % (ref 11.7–15.4)
WBC: 5.6 10*3/uL (ref 3.4–10.8)

## 2023-07-08 LAB — LIPID PANEL
Chol/HDL Ratio: 3.4 {ratio} (ref 0.0–4.4)
Cholesterol, Total: 161 mg/dL (ref 100–199)
HDL: 48 mg/dL (ref >39)
LDL Chol Calc (NIH): 75 mg/dL (ref 0–99)
Triglycerides: 230 mg/dL — ABNORMAL HIGH (ref 0–149)
VLDL Cholesterol Cal: 38 mg/dL (ref 5–40)

## 2023-07-08 LAB — CMP14+EGFR
ALT: 24 [IU]/L (ref 0–32)
AST: 19 [IU]/L (ref 0–40)
Albumin: 4.7 g/dL (ref 3.8–4.9)
Alkaline Phosphatase: 68 [IU]/L (ref 44–121)
BUN/Creatinine Ratio: 13 (ref 9–23)
BUN: 10 mg/dL (ref 6–24)
Bilirubin Total: 0.5 mg/dL (ref 0.0–1.2)
CO2: 24 mmol/L (ref 20–29)
Calcium: 9.4 mg/dL (ref 8.7–10.2)
Chloride: 103 mmol/L (ref 96–106)
Creatinine, Ser: 0.8 mg/dL (ref 0.57–1.00)
Globulin, Total: 2.6 g/dL (ref 1.5–4.5)
Glucose: 94 mg/dL (ref 70–99)
Potassium: 4.3 mmol/L (ref 3.5–5.2)
Sodium: 141 mmol/L (ref 134–144)
Total Protein: 7.3 g/dL (ref 6.0–8.5)
eGFR: 88 mL/min/{1.73_m2} (ref >59)

## 2023-07-08 LAB — MICROALBUMIN / CREATININE URINE RATIO
Creatinine, Urine: 140 mg/dL
Microalb/Creat Ratio: 3 mg/g{creat} (ref 0–29)
Microalbumin, Urine: 4.2 ug/mL

## 2023-07-08 LAB — HEMOGLOBIN A1C
Est. average glucose Bld gHb Est-mCnc: 80 mg/dL
Hgb A1c MFr Bld: 4.4 % — ABNORMAL LOW (ref 4.8–5.6)

## 2023-07-11 ENCOUNTER — Encounter: Payer: Self-pay | Admitting: Physician Assistant

## 2023-08-09 ENCOUNTER — Telehealth: Payer: Self-pay

## 2023-08-09 NOTE — Telephone Encounter (Signed)
 This was put in [place] box

## 2023-09-15 ENCOUNTER — Other Ambulatory Visit: Payer: Self-pay

## 2023-09-15 DIAGNOSIS — G4700 Insomnia, unspecified: Secondary | ICD-10-CM

## 2023-09-16 ENCOUNTER — Other Ambulatory Visit: Payer: Self-pay | Admitting: Physician Assistant

## 2023-09-16 DIAGNOSIS — G4700 Insomnia, unspecified: Secondary | ICD-10-CM

## 2023-09-17 ENCOUNTER — Other Ambulatory Visit: Payer: Self-pay | Admitting: Physician Assistant

## 2023-09-17 DIAGNOSIS — E1169 Type 2 diabetes mellitus with other specified complication: Secondary | ICD-10-CM

## 2023-09-17 DIAGNOSIS — Z833 Family history of diabetes mellitus: Secondary | ICD-10-CM

## 2023-09-17 DIAGNOSIS — E88819 Insulin resistance, unspecified: Secondary | ICD-10-CM

## 2023-09-17 DIAGNOSIS — Z6835 Body mass index (BMI) 35.0-35.9, adult: Secondary | ICD-10-CM

## 2023-09-18 ENCOUNTER — Other Ambulatory Visit: Payer: Self-pay | Admitting: Physician Assistant

## 2023-09-18 DIAGNOSIS — E88819 Insulin resistance, unspecified: Secondary | ICD-10-CM

## 2023-09-18 DIAGNOSIS — Z833 Family history of diabetes mellitus: Secondary | ICD-10-CM

## 2023-09-18 DIAGNOSIS — Z6835 Body mass index (BMI) 35.0-35.9, adult: Secondary | ICD-10-CM

## 2023-09-18 DIAGNOSIS — G4700 Insomnia, unspecified: Secondary | ICD-10-CM

## 2023-09-18 DIAGNOSIS — E1169 Type 2 diabetes mellitus with other specified complication: Secondary | ICD-10-CM

## 2023-09-18 MED ORDER — OZEMPIC (2 MG/DOSE) 8 MG/3ML ~~LOC~~ SOPN: PEN_INJECTOR | SUBCUTANEOUS | 3 refills | Status: DC

## 2023-09-18 MED ORDER — ZOLPIDEM TARTRATE 10 MG PO TABS: ORAL_TABLET | ORAL | 1 refills | Status: DC

## 2023-09-18 NOTE — Telephone Encounter (Signed)
 Copied from CRM [id number]. Topic: Clinical - Medication Refill >> Sep 18, 2023 10:37 AM Elle L wrote: Most Recent Primary Care Visit:  Provider: CRAFT, BRADY  Department: [facility]  Visit Type: OFFICE VISIT  Date: 07/07/2023  Medication: zolpidem (AMBIEN) 10 MG tablet   Has the patient contacted their pharmacy? Yes  Is this the correct pharmacy for this prescription? Yes  This is the patient's preferred pharmacy:  [facility] [address] - [place], [place] - [address] [place] [address] Phone: [phone] Fax: [phone]  Has the prescription been filled recently? No  Is the patient out of the medication? Yes  Has the patient been seen for an appointment in the last year OR does the patient have an upcoming appointment? Yes  Can we respond through MyChart? No  Agent: Please be advised that Rx refills may take up to 3 business days. We ask that you follow-up with your pharmacy.

## 2023-09-18 NOTE — Telephone Encounter (Signed)
 Copied from CRM [id number]. Topic: Clinical - Medication Refill >> Sep 18, 2023 10:39 AM Elle L wrote: Most Recent Primary Care Visit:  Provider: CRAFT, BRADY  Department: [facility]  Visit Type: OFFICE VISIT  Date: 07/07/2023  Medication: OZEMPIC, 2 MG/DOSE, 8 MG/3ML SOPN  Has the patient contacted their pharmacy? Yes  Is this the correct pharmacy for this prescription? Yes  This is the patient's preferred pharmacy:   [id number] [place], [place] - [address] AT [facility] [address] [place] [address] Phone: [phone] Fax: [phone]   Has the prescription been filled recently? No  Is the patient out of the medication? Yes  Has the patient been seen for an appointment in the last year OR does the patient have an upcoming appointment? Yes  Can we respond through MyChart? No  Agent: Please be advised that Rx refills may take up to 3 business days. We ask that you follow-up with your pharmacy.

## 2023-09-29 ENCOUNTER — Other Ambulatory Visit: Payer: Self-pay | Admitting: Physician Assistant

## 2023-09-29 DIAGNOSIS — K219 Gastro-esophageal reflux disease without esophagitis: Secondary | ICD-10-CM

## 2023-09-29 IMAGING — MR MR CERVICAL SPINE W/O CM
4 of 5 series · 30 of 48 positions shown · non-contrast
Comparison: No prior MRI, correlation is made with cervical spine
radiographs 05/20/2021

CLINICAL DATA: Neck and back pain

EXAM:
MRI CERVICAL SPINE WITHOUT CONTRAST
TECHNIQUE: Multiplanar, multisequence MR imaging of the cervical spine was
performed. No intravenous contrast was administered.

[Series 2: T2 · sagittal · 3.0mm · 0.66mm/px · 8 of 15 slices shown (1 of 2)]
[im 1/15]
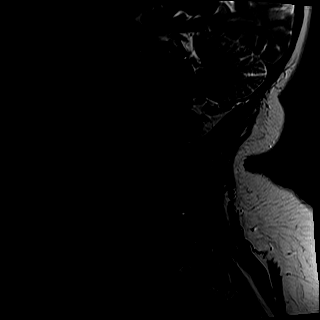
[im 3/15]
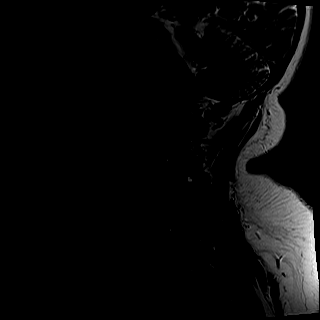
[im 5/15]
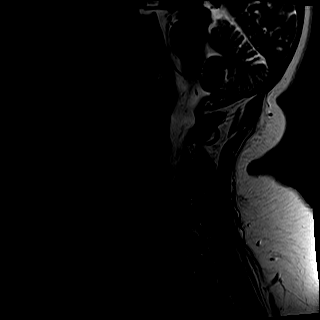
[im 7/15]
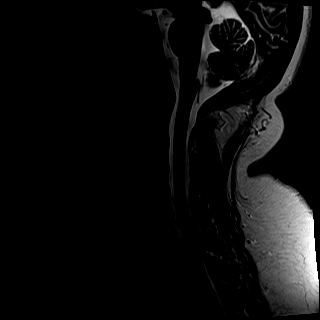
[im 9/15]
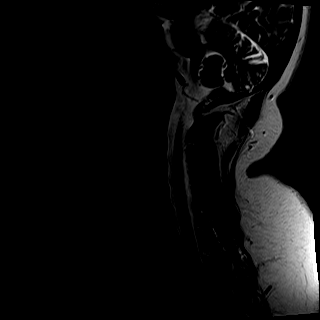
[im 11/15]
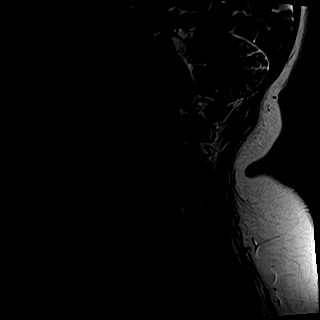
[im 13/15]
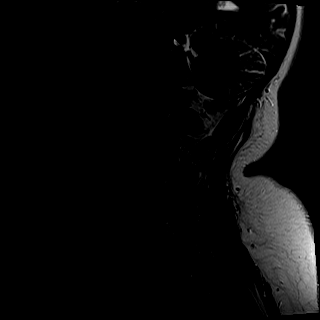
[im 15/15]
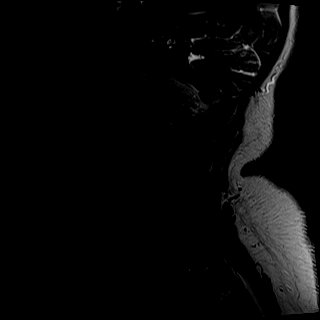

[Series 3: T1 · sagittal · 3.0mm · 0.41mm/px · 7 of 15 slices shown]
[im 1/15]
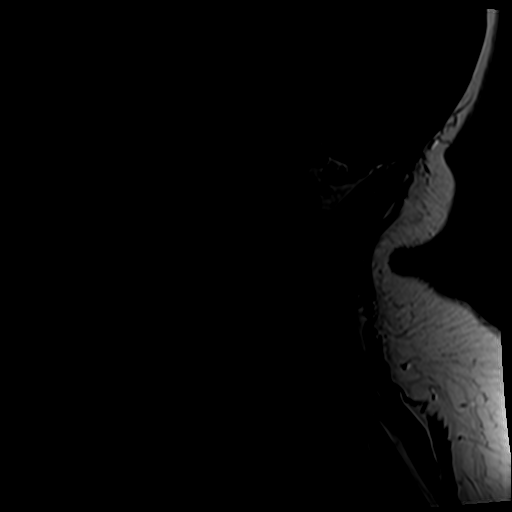
[im 3/15]
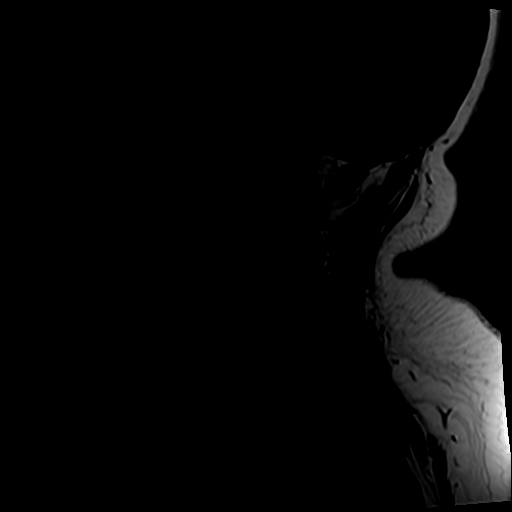
[im 5/15]
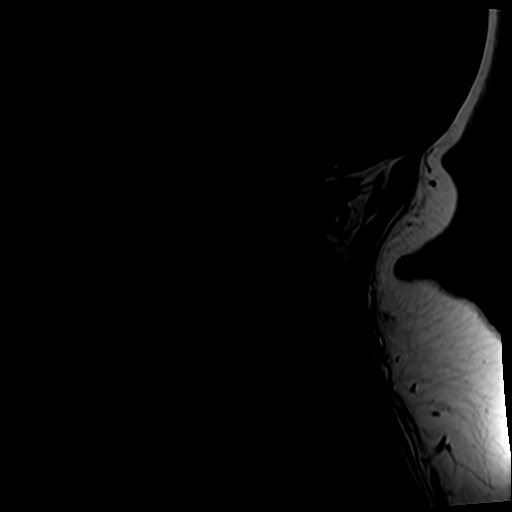
[im 8/15]
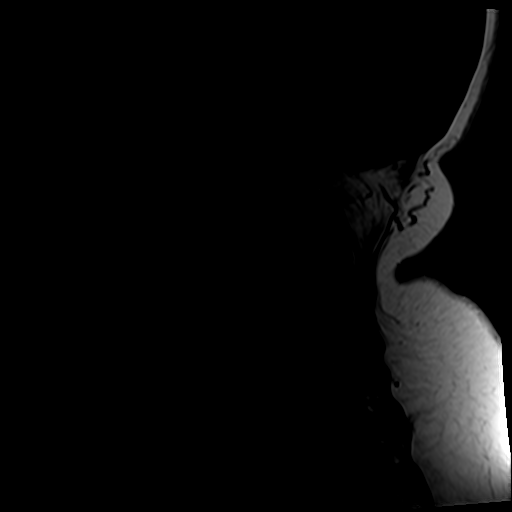
[im 10/15]
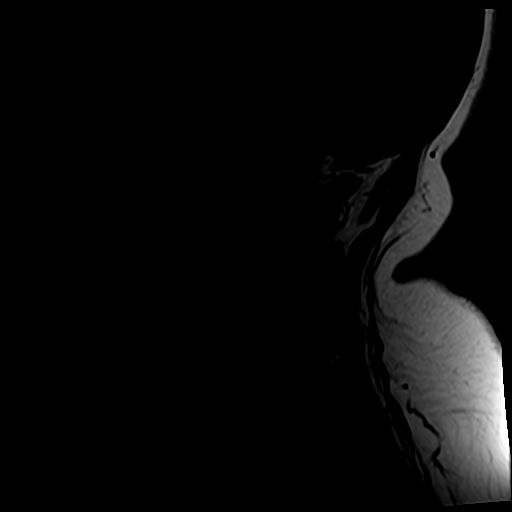
[im 12/15]
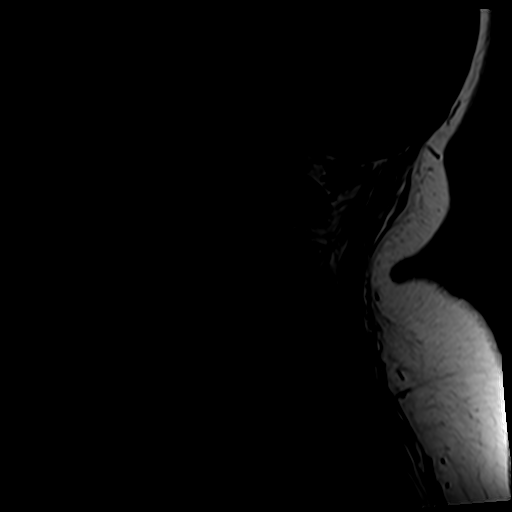
[im 15/15]
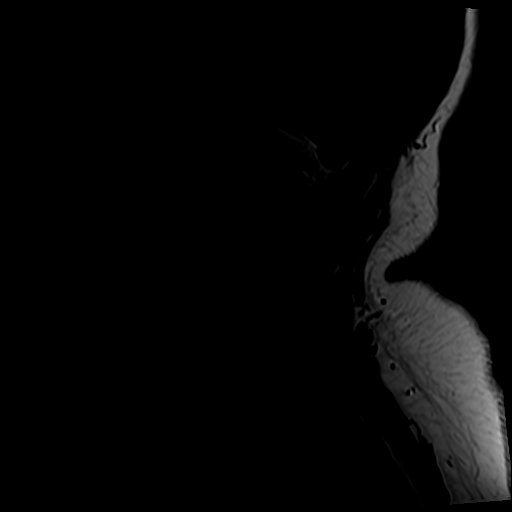

[Series 4: tir sag · sagittal · 3.0mm · 0.41mm/px · 6 of 15 slices shown]
[im 1/15]
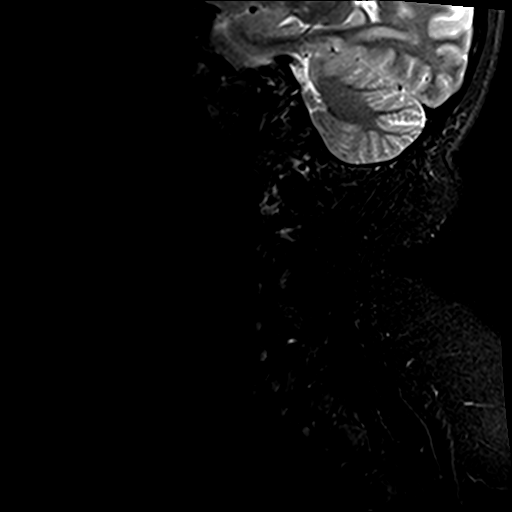
[im 3/15]
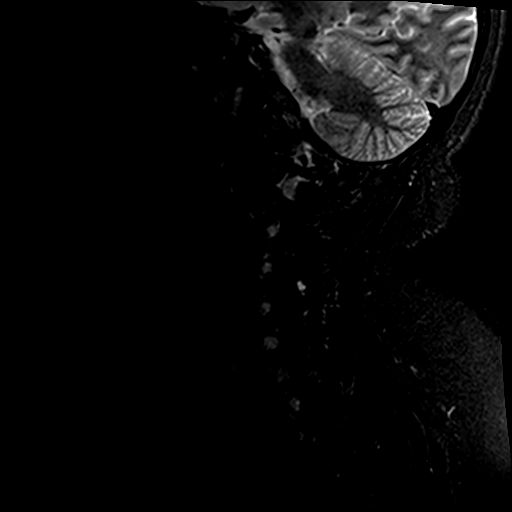
[im 5/15]
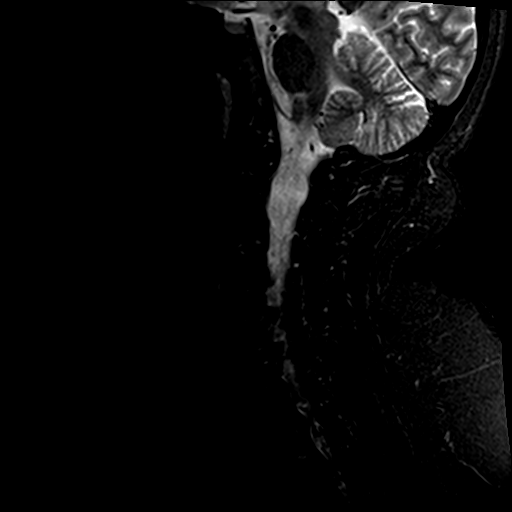
[im 8/15]
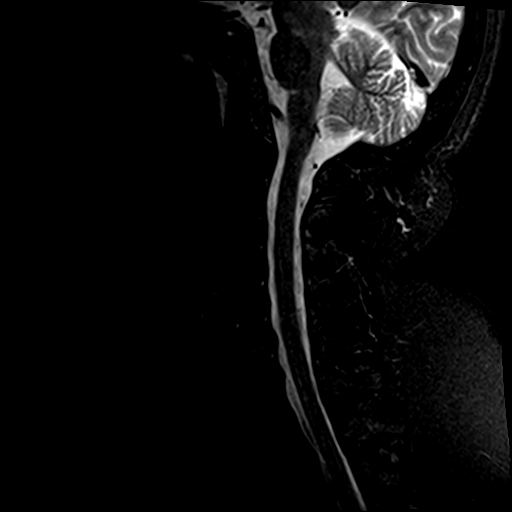
[im 10/15]
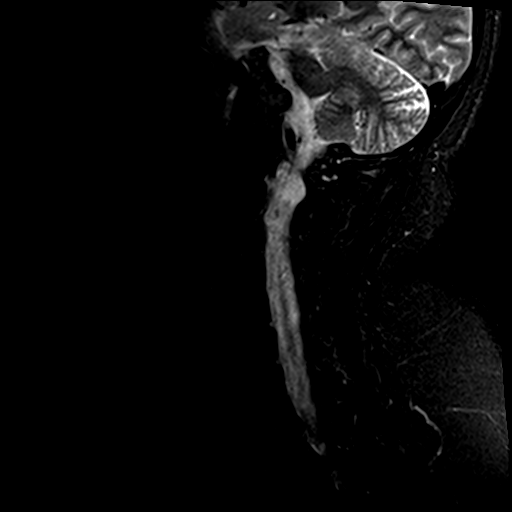
[im 12/15]
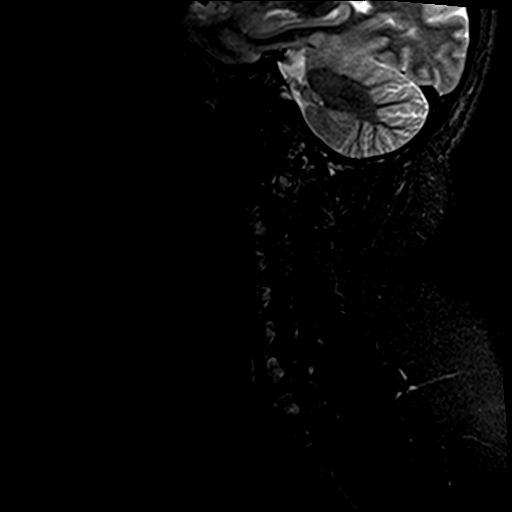

[Series 6: T2 · axial · 3.0mm · 0.70mm/px · z∈[-57,+40]mm · 9 of 27 slices shown (2 of 2)]
[im 1/27]
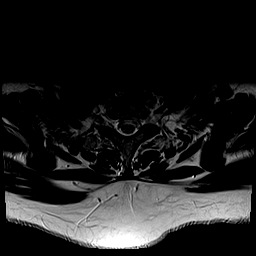
[im 5/27]
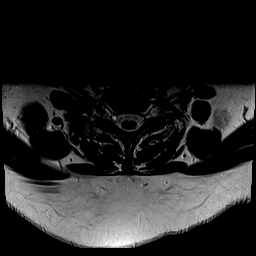
[im 9/27]
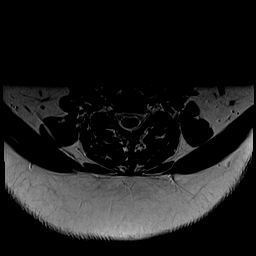
[im 11/27]
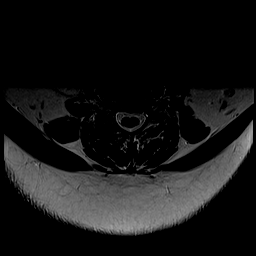
[im 14/27]
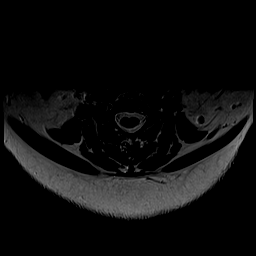
[im 16/27]
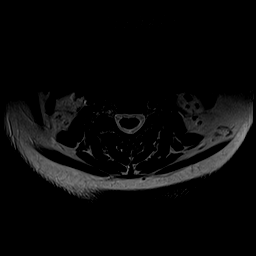
[im 18/27]
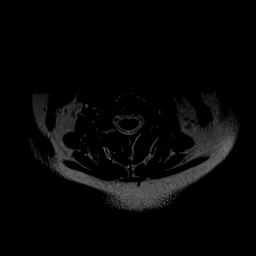
[im 22/27]
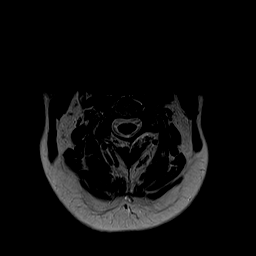
[im 27/27]
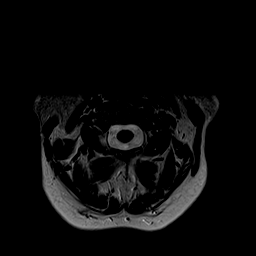

[30 of 48 positions shown; findings below may reference images not displayed]

FINDINGS: Alignment: Physiologic.

Vertebrae: No fracture, evidence of discitis, or bone lesion.

Cord: Normal signal and morphology.

Posterior Fossa, vertebral arteries, paraspinal tissues: Negative.

Disc levels:

C2-C3: No significant disc bulge. No spinal canal stenosis or
neuroforaminal narrowing.

C3-C4: No significant disc bulge. No spinal canal stenosis or
neuroforaminal narrowing.

C4-C5: No significant disc bulge. No spinal canal stenosis or
neuroforaminal narrowing.

C5-C6: Minimal disc bulge. Mild left-greater-than-right
uncovertebral hypertrophy. Mild left neural foraminal narrowing. No
spinal canal stenosis.

C6-C7: No significant disc bulge. Mild uncovertebral and facet
arthropathy. No spinal canal stenosis. Mild left neural foraminal
narrowing.

C7-T1: No significant disc bulge. No spinal canal stenosis or
neuroforaminal narrowing.
IMPRESSION: Mild degenerative changes in the cervical spine, with mild left
neural foraminal narrowing at C5-C6 and C6-C7. No spinal canal
stenosis.

## 2023-11-07 ENCOUNTER — Other Ambulatory Visit: Payer: Self-pay

## 2023-11-07 DIAGNOSIS — G4700 Insomnia, unspecified: Secondary | ICD-10-CM

## 2023-11-07 MED ORDER — ZOLPIDEM TARTRATE 10 MG PO TABS: ORAL_TABLET | ORAL | 1 refills | Status: DC

## 2024-01-03 NOTE — Progress Notes (Signed)
 Subjective:  Patient ID: Madison Carey, female    DOB: 1970/12/26  Age: 53 y.o. MRN: [id number]  Chief Complaint  Patient presents with   Medical Management of Chronic Issues    Diabetes    HPI:  Discussed the use of AI scribe software for clinical note transcription with the patient, who gave verbal consent to proceed.  History of Present Illness   Madison Carey is a 53 year old female with type 2 diabetes and GERD who presents for a routine follow-up visit.  She has no major complaints and her medications are working well. No dizziness, headaches, or abdominal pain. She has been on Ambien  for approximately 25 years without experiencing abnormal dreams or sleepwalking. She recently returned from a 14-day trip to [place] , which she enjoyed despite the long daylight hours, and Ambien  helped her sleep well during the trip.  She is currently on Ozempic  for her type 2 diabetes. She has not experienced any side effects from Ozempic  but notes that her weight has plateaued for the past year and a half. She takes her Ozempic  injection weekly on Fridays and has one dose remaining before needing a refill.  She denies any worsening of her acid reflux symptoms while on Prilosec. She has no issues with her current medication regimen and reports no new symptoms.          01/04/2024    8:17 AM 07/07/2023    7:32 AM 01/05/2023    7:39 AM 12/20/2021    7:46 AM 09/17/2021    9:24 AM  Depression screen PHQ 2/9  Decreased Interest 0 0 0 0 0  Down, Depressed, Hopeless 0 0 0 0 0  PHQ - 2 Score 0 0 0 0 0  Altered sleeping 0 0 1 0   Tired, decreased energy 0 0 0 0   Change in appetite 0 0 0 0   Feeling bad or failure about yourself  0 0 0 0   Trouble concentrating 0 0 0 0   Moving slowly or fidgety/restless 0 0 0 0   Suicidal thoughts 0 0 0 0   PHQ-9 Score 0 0 1 0   Difficult doing work/chores Not difficult at all Not difficult at all Not difficult at all          01/04/2024    8:17 AM   Fall Risk   Falls in the past year? 0  Number falls in past yr: 0  Injury with Fall? 0  Risk for fall due to : No Fall Risks  Follow up Falls evaluation completed    Patient Care Team: Milon Cleaves, [place] as PCP - General (Physician Assistant)   Review of Systems  Constitutional:  Negative for appetite change, chills, diaphoresis, fatigue and fever.  HENT:  Negative for congestion, ear pain, sinus pressure, sinus pain and sore throat.   Eyes: Negative.   Respiratory:  Negative for cough, chest tightness, shortness of breath and wheezing.   Cardiovascular:  Negative for chest pain and palpitations.  Gastrointestinal:  Negative for abdominal pain, constipation, diarrhea, nausea and vomiting.  Endocrine: Negative.   Genitourinary:  Negative for dysuria and hematuria.  Musculoskeletal:  Negative for arthralgias, back pain, joint swelling and myalgias.  Skin:  Negative for rash.  Allergic/Immunologic: Negative.   Neurological:  Negative for dizziness, weakness and headaches.  Hematological: Negative.   Psychiatric/Behavioral:  Negative for dysphoric mood. The patient is not nervous/anxious.     Current Outpatient Medications on File  Prior to Visit  Medication Sig Dispense Refill   ibuprofen  (ADVIL ) 800 MG tablet Take 1 tablet (800 mg total) by mouth every 8 (eight) hours as needed. 90 tablet 3   No current facility-administered medications on file prior to visit.   Past Medical History:  Diagnosis Date   Adjustment insomnia    Diabetes mellitus without complication (HCC)    GERD (gastroesophageal reflux disease)    Hypertension    Impaired fasting glucose    Mixed hyperlipidemia    Other vitamin B12 deficiency anemias    Plantar fascial fibromatosis    Right arm fracture    Past Surgical History:  Procedure Laterality Date   ABDOMINAL HYSTERECTOMY     bladder tacking      CHOLECYSTECTOMY     crevical ablation      CYSTOSCOPY N/A 02/16/2016   Procedure: CYSTOSCOPY;   Surgeon: Glendia Elizabeth, MD;  Location: [facility];  Service: Urology;  Laterality: N/A;   REVISION URINARY SLING N/A 02/16/2016   Procedure: REMOVAL URINARY SLING;  Surgeon: Glendia Elizabeth, MD;  Location: [facility];  Service: Urology;  Laterality: N/A;    Family History  Problem Relation Age of Onset   COPD Mother    Asthma Mother    Heart disease Father    Hyperlipidemia Father    Diabetes Father    Colon cancer Neg Hx    Pancreatic cancer Neg Hx    Stomach cancer Neg Hx    Liver disease Neg Hx    Social History   Socioeconomic History   Marital status: Married    Spouse name: Not on file   Number of children: 3   Years of education: Not on file   Highest education level: Some college, no degree  Occupational History   Occupation: Solicitor  Tobacco Use   Smoking status: Former    Current packs/day: 0.00    Average packs/day: 0.3 packs/day for 5.0 years (1.3 ttl pk-yrs)    Types: Cigarettes    Start date: 2015    Quit date: 2020    Years since quitting: 5.6   Smokeless tobacco: Never  Vaping Use   Vaping status: Every Day   Substances: Nicotine, Flavoring  Substance and Sexual Activity   Alcohol use: Yes    Alcohol/week: 2.0 standard drinks of alcohol    Types: 2 Glasses of wine per week    Comment: occasionally   Drug use: No   Sexual activity: Yes    Partners: Male  Other Topics Concern   Not on file  Social History Narrative   Not on file   Social Drivers of Health   Financial Resource Strain: Low Risk  (01/01/2024)   Overall Financial Resource Strain (CARDIA)    Difficulty of Paying Living Expenses: Not hard at all  Food Insecurity: No Food Insecurity (01/01/2024)   Hunger Vital Sign    Worried About Running Out of Food in the Last Year: Never true    Ran Out of Food in the Last Year: Never true  Transportation Needs: No Transportation Needs (01/01/2024)   PRAPARE - Administrator, Civil Service (Medical): No    Lack of Transportation  (Non-Medical): No  Physical Activity: Insufficiently Active (01/01/2024)   Exercise Vital Sign    Days of Exercise per Week: 2 days    Minutes of Exercise per Session: 30 min  Stress: Stress Concern Present (01/01/2024)   [employer] of Occupational Health - Occupational Stress Questionnaire  Feeling of Stress: To some extent  Social Connections: Socially Integrated (01/01/2024)   Social Connection and Isolation Panel    Frequency of Communication with Friends and Family: More than three times a week    Frequency of Social Gatherings with Friends and Family: Once a week    Attends Religious Services: More than 4 times per year    Active Member of [employer] or Organizations: Yes    Attends Engineer, structural: More than 4 times per year    Marital Status: Married    Objective:  BP 104/62   Pulse 92   Temp 98.1 F (36.7 C) (Temporal)   Resp 16   Ht 5' 2 (1.575 m)   Wt 178 lb 6.4 oz (80.9 kg)   SpO2 97%   BMI 32.63 kg/m      01/04/2024    8:11 AM 07/07/2023    7:29 AM 01/05/2023    7:36 AM  BP/Weight  Systolic BP 104 102 112  Diastolic BP 62 60 62  Wt. (Lbs) 178.4 178 177  BMI 32.63 kg/m2 32.56 kg/m2 32.37 kg/m2    Physical Exam Vitals reviewed.  Constitutional:      Appearance: Normal appearance.  Cardiovascular:     Rate and Rhythm: Normal rate and regular rhythm.     Heart sounds: Normal heart sounds.  Pulmonary:     Effort: Pulmonary effort is normal.     Breath sounds: Normal breath sounds.  Abdominal:     General: Bowel sounds are normal.     Palpations: Abdomen is soft.     Tenderness: There is no abdominal tenderness.  Neurological:     Mental Status: She is alert and oriented to person, place, and time.  Psychiatric:        Mood and Affect: Mood normal.        Behavior: Behavior normal.         Lab Results  Component Value Date   WBC 5.2 01/04/2024   HGB 12.0 01/04/2024   HCT 37.0 01/04/2024   PLT 268 01/04/2024   GLUCOSE 89  01/04/2024   CHOL 150 01/04/2024   TRIG 214 (H) 01/04/2024   HDL 44 01/04/2024   LDLCALC 71 01/04/2024   ALT 18 01/04/2024   AST 21 01/04/2024   NA 141 01/04/2024   K 4.4 01/04/2024   CL 105 01/04/2024   CREATININE 0.76 01/04/2024   BUN 10 01/04/2024   CO2 20 01/04/2024   TSH 0.852 01/05/2023   HGBA1C <4.2 (L) 01/04/2024   MICROALBUR 80 01/13/2021      Assessment & Plan:  DM type 2 with diabetic mixed hyperlipidemia (HCC) Assessment & Plan: Diabetes well-controlled with current A1c levels. Ozempic  effective for glycemic control. - Send prescription for Mounjaro  to [facility] to assess insurance coverage as alternative to Ozempic  for potential weight loss benefits. - Continue Ozempic  until insurance decision is made. - Monitor A1c levels and adjust treatment as necessary. Lab Results  Component Value Date   HGBA1C <4.2 (L) 01/04/2024   HGBA1C 4.4 (L) 07/07/2023   HGBA1C 4.6 (L) 10/04/2022     Orders: -     Hemoglobin A1c  Mixed hyperlipidemia Assessment & Plan: Well controlled.  Continue to work on eating a healthy diet and exercise.  Labs drawn today.   No major side effects reported, and no issues with compliance. The current medical regimen is effective;  continue present plan with Zetia  10mg , Crestor  5mg  Will adjust medication as needed depending on labs  Lab Results  Component Value Date   LDLCALC 71 01/04/2024     Orders: -     CBC with Differential/Platelet -     Comprehensive metabolic panel with GFR -     Lipid panel -     Ezetimibe ; Take 1 tablet (10 mg total) by mouth daily.  Dispense: 90 tablet; Refill: 3 -     Rosuvastatin  Calcium ; Take 1 tablet (5 mg total) by mouth daily.  Dispense: 90 tablet; Refill: 3  Chronic hip pain, right Assessment & Plan: Deep hip pain, likely arthritis based on previous x-ray. Pain affects sleep and mobility. -Start physical therapy for hip pain. -Consider referral to orthopedist if no improvement with physical  therapy.   Gastroesophageal reflux disease, unspecified whether esophagitis present Assessment & Plan: GERD well-managed with Prilosec 40mg    Neck pain on right side Assessment & Plan: Chronic neck pain, limited range of motion. Previous x-ray showed arthritis. Pain not relieved by Flexeril . -Continue current management. -Consider turmeric supplement for anti-inflammatory properties.  Orders: -     Cyclobenzaprine  HCl; Take one tablet by mouth daily.  Dispense: 90 tablet; Refill: 1  Gastroesophageal reflux disease without esophagitis Assessment & Plan: GERD well-managed with Prilosec 40mg   Orders: -     Omeprazole ; Take 1 capsule (40 mg total) by mouth every morning.  Dispense: 90 capsule; Refill: 2  Insomnia, unspecified type Assessment & Plan: Chronic insomnia well-managed with Ambien . No adverse effects reported. - Continue Ambien  as currently prescribed.   Orders: -     Zolpidem  Tartrate; TAKE ONE TABLET BY MOUTH NIGHTLY AT BEDTIME AS NEEDED  Dispense: 30 tablet; Refill: 1  Insulin resistance Assessment & Plan: Continue taking the Ozempic  2mg  as directed Will look into changing to Mounjaro  to improve weight management and keep sugars low Lab Results  Component Value Date   HGBA1C <4.2 (L) 01/04/2024   HGBA1C 4.4 (L) 07/07/2023   HGBA1C 4.6 (L) 10/04/2022     Orders: -     Ozempic  (2 MG/DOSE); INJECT 2 MG UNDER THE SKIN ONCE A WEEK  Dispense: 3 mL; Refill: 3  Abnormal metabolic state in diabetes mellitus (HCC) Assessment & Plan: Obesity management stable. Ozempic  plateaued for weight loss. - Consider switching to Mounjaro  for potential additional weight loss benefits if insurance approves.  Orders: -     Ozempic  (2 MG/DOSE); INJECT 2 MG UNDER THE SKIN ONCE A WEEK  Dispense: 3 mL; Refill: 3  BMI 35.0-35.9,adult -     Ozempic  (2 MG/DOSE); INJECT 2 MG UNDER THE SKIN ONCE A WEEK  Dispense: 3 mL; Refill: 3  Family history of type 2 diabetes mellitus in father -      Ozempic  (2 MG/DOSE); INJECT 2 MG UNDER THE SKIN ONCE A WEEK  Dispense: 3 mL; Refill: 3   Meds ordered this encounter  Medications   ezetimibe  (ZETIA ) 10 MG tablet    Sig: Take 1 tablet (10 mg total) by mouth daily.    Dispense:  90 tablet    Refill:  3   cyclobenzaprine  (FLEXERIL ) 10 MG tablet    Sig: Take one tablet by mouth daily.    Dispense:  90 tablet    Refill:  1    This prescription was filled on 03/28/2022. Any refills authorized will be placed on file.   rosuvastatin  (CRESTOR ) 5 MG tablet    Sig: Take 1 tablet (5 mg total) by mouth daily.    Dispense:  90 tablet    Refill:  3  omeprazole  (PRILOSEC) 40 MG capsule    Sig: Take 1 capsule (40 mg total) by mouth every morning.    Dispense:  90 capsule    Refill:  2    This prescription was filled on 09/09/2023. Any refills authorized will be placed on file.   zolpidem  (AMBIEN ) 10 MG tablet    Sig: TAKE ONE TABLET BY MOUTH NIGHTLY AT BEDTIME AS NEEDED    Dispense:  30 tablet    Refill:  1   Semaglutide , 2 MG/DOSE, (OZEMPIC , 2 MG/DOSE,) 8 MG/3ML SOPN    Sig: INJECT 2 MG UNDER THE SKIN ONCE A WEEK    Dispense:  3 mL    Refill:  3    Orders Placed This Encounter  Procedures   CBC with Differential/Platelet   Comprehensive metabolic panel with GFR   Lipid panel   Hemoglobin A1c     Follow-up: Return in about 6 months (around 07/06/2024) for Chronic, Nola.   I,Lauren M Auman,acting as a Neurosurgeon for [employer], PA.,have documented all relevant documentation on the behalf of Nola Angles, PA,as directed by  Nola Angles, PA while in the presence of Nola Angles, [place].   An After Visit Summary was printed and given to the patient.  Nola Angles, [place] [facility] [phone]

## 2024-01-04 ENCOUNTER — Encounter: Payer: Self-pay | Admitting: Physician Assistant

## 2024-01-04 ENCOUNTER — Ambulatory Visit: Payer: 59 | Admitting: Physician Assistant

## 2024-01-04 VITALS — BP 104/62 | HR 92 | Temp 98.1°F | Resp 16 | Ht 62.0 in | Wt 178.4 lb

## 2024-01-04 DIAGNOSIS — E1169 Type 2 diabetes mellitus with other specified complication: Secondary | ICD-10-CM | POA: Diagnosis not present

## 2024-01-04 DIAGNOSIS — M25551 Pain in right hip: Secondary | ICD-10-CM

## 2024-01-04 DIAGNOSIS — G8929 Other chronic pain: Secondary | ICD-10-CM

## 2024-01-04 DIAGNOSIS — Z6835 Body mass index (BMI) 35.0-35.9, adult: Secondary | ICD-10-CM

## 2024-01-04 DIAGNOSIS — M542 Cervicalgia: Secondary | ICD-10-CM

## 2024-01-04 DIAGNOSIS — G4700 Insomnia, unspecified: Secondary | ICD-10-CM

## 2024-01-04 DIAGNOSIS — K219 Gastro-esophageal reflux disease without esophagitis: Secondary | ICD-10-CM

## 2024-01-04 DIAGNOSIS — L659 Nonscarring hair loss, unspecified: Secondary | ICD-10-CM

## 2024-01-04 DIAGNOSIS — E782 Mixed hyperlipidemia: Secondary | ICD-10-CM

## 2024-01-04 DIAGNOSIS — Z833 Family history of diabetes mellitus: Secondary | ICD-10-CM

## 2024-01-04 DIAGNOSIS — E889 Metabolic disorder, unspecified: Secondary | ICD-10-CM

## 2024-01-04 DIAGNOSIS — E88819 Insulin resistance, unspecified: Secondary | ICD-10-CM

## 2024-01-04 MED ORDER — ROSUVASTATIN CALCIUM 5 MG PO TABS: 5.0000 mg | ORAL_TABLET | Freq: Every day | ORAL | 3 refills | Status: AC

## 2024-01-04 MED ORDER — EZETIMIBE 10 MG PO TABS
10.0000 mg | ORAL_TABLET | Freq: Every day | ORAL | 3 refills | Status: AC
Start: 2024-01-04 — End: 2024-12-29

## 2024-01-04 MED ORDER — CYCLOBENZAPRINE HCL 10 MG PO TABS: ORAL_TABLET | ORAL | 1 refills | Status: DC

## 2024-01-04 MED ORDER — OMEPRAZOLE 40 MG PO CPDR: 40.0000 mg | DELAYED_RELEASE_CAPSULE | Freq: Every morning | ORAL | 2 refills | Status: AC

## 2024-01-04 MED ORDER — ZOLPIDEM TARTRATE 10 MG PO TABS
ORAL_TABLET | ORAL | 1 refills | Status: DC
Start: 2024-01-04 — End: 2024-02-14

## 2024-01-04 MED ORDER — OZEMPIC (2 MG/DOSE) 8 MG/3ML ~~LOC~~ SOPN: PEN_INJECTOR | SUBCUTANEOUS | 3 refills | Status: DC

## 2024-01-04 NOTE — Patient Instructions (Signed)
 VISIT SUMMARY:  Today, you had a routine follow-up visit to check on your type 2 diabetes, GERD, and insomnia. You reported no major complaints and mentioned that your medications are working well. You recently enjoyed a trip to [place]  and managed your sleep well with Ambien . Your diabetes is well-controlled, and you have not experienced any side effects from your current medications.  YOUR PLAN:  -TYPE 2 DIABETES MELLITUS WITHOUT COMPLICATIONS: Type 2 diabetes is a condition where your body does not use insulin properly, leading to high blood sugar levels. Your diabetes is well-controlled with your current medication, Ozempic . We will send a prescription for Mounjaro to your pharmacy to see if your insurance will cover it as an alternative to Ozempic , which may help with weight loss. Continue taking Ozempic  until we hear back from your insurance. We will keep monitoring your A1c levels and adjust your treatment as needed.  -OBESITY: Obesity is a condition where you have an excessive amount of body fat. Your weight has plateaued with Ozempic . If your insurance approves, we may switch to Mounjaro to help with additional weight loss.  -GASTROESOPHAGEAL REFLUX DISEASE (GERD) WITHOUT ESOPHAGITIS: GERD is a condition where stomach acid frequently flows back into the tube connecting your mouth and stomach, causing discomfort. Your GERD is well-managed with Prilosec, and you should continue taking it as prescribed.  -INSOMNIA: Insomnia is a sleep disorder where you have trouble falling or staying asleep. Your chronic insomnia is well-managed with Ambien , and you have not reported any adverse effects. Continue taking Ambien  as currently prescribed.  INSTRUCTIONS:  We will send a prescription for Mounjaro to [facility] to check if your insurance will cover it. Continue taking Ozempic  until we receive a decision from your insurance. We will keep monitoring your A1c levels and adjust your treatment as  necessary.

## 2024-01-05 LAB — COMPREHENSIVE METABOLIC PANEL WITH GFR
ALT: 18 IU/L (ref 0–32)
AST: 21 IU/L (ref 0–40)
Albumin: 4.6 g/dL (ref 3.8–4.9)
Alkaline Phosphatase: 63 IU/L (ref 44–121)
BUN/Creatinine Ratio: 13 (ref 9–23)
BUN: 10 mg/dL (ref 6–24)
Bilirubin Total: 0.4 mg/dL (ref 0.0–1.2)
CO2: 20 mmol/L (ref 20–29)
Calcium: 9.2 mg/dL (ref 8.7–10.2)
Chloride: 105 mmol/L (ref 96–106)
Creatinine, Ser: 0.76 mg/dL (ref 0.57–1.00)
Globulin, Total: 2.4 g/dL (ref 1.5–4.5)
Glucose: 89 mg/dL (ref 70–99)
Potassium: 4.4 mmol/L (ref 3.5–5.2)
Sodium: 141 mmol/L (ref 134–144)
Total Protein: 7 g/dL (ref 6.0–8.5)
eGFR: 93 mL/min/1.73 (ref >59)

## 2024-01-05 LAB — CBC WITH DIFFERENTIAL/PLATELET
Basophils Absolute: 0 x10E3/uL (ref 0.0–0.2)
Basos: 1 %
EOS (ABSOLUTE): 0.1 x10E3/uL (ref 0.0–0.4)
Eos: 2 %
Hematocrit: 37 % (ref 34.0–46.6)
Hemoglobin: 12 g/dL (ref 11.1–15.9)
Immature Grans (Abs): 0 x10E3/uL (ref 0.0–0.1)
Immature Granulocytes: 0 %
Lymphocytes Absolute: 1.1 x10E3/uL (ref 0.7–3.1)
Lymphs: 21 %
MCH: 30.8 pg (ref 26.6–33.0)
MCHC: 32.4 g/dL (ref 31.5–35.7)
MCV: 95 fL (ref 79–97)
Monocytes Absolute: 0.5 x10E3/uL (ref 0.1–0.9)
Monocytes: 10 %
Neutrophils Absolute: 3.5 x10E3/uL (ref 1.4–7.0)
Neutrophils: 66 %
Platelets: 268 x10E3/uL (ref 150–450)
RBC: 3.89 x10E6/uL (ref 3.77–5.28)
RDW: 14.4 % (ref 11.7–15.4)
WBC: 5.2 x10E3/uL (ref 3.4–10.8)

## 2024-01-05 LAB — LIPID PANEL
Chol/HDL Ratio: 3.4 ratio (ref 0.0–4.4)
Cholesterol, Total: 150 mg/dL (ref 100–199)
HDL: 44 mg/dL (ref >39)
LDL Chol Calc (NIH): 71 mg/dL (ref 0–99)
Triglycerides: 214 mg/dL — ABNORMAL HIGH (ref 0–149)
VLDL Cholesterol Cal: 35 mg/dL (ref 5–40)

## 2024-01-05 LAB — HEMOGLOBIN A1C
Est. average glucose Bld gHb Est-mCnc: <74 mg/dL
Hgb A1c MFr Bld: <4.2 % — ABNORMAL LOW (ref 4.8–5.6)

## 2024-01-10 ENCOUNTER — Ambulatory Visit: Payer: Self-pay | Admitting: Physician Assistant

## 2024-01-15 NOTE — Assessment & Plan Note (Signed)
 GERD well-managed with Prilosec 40mg 

## 2024-01-15 NOTE — Assessment & Plan Note (Signed)
 Well controlled.  Continue to work on eating a healthy diet and exercise.  Labs drawn today.   No major side effects reported, and no issues with compliance. The current medical regimen is effective;  continue present plan with Zetia  10mg , Crestor  5mg  Will adjust medication as needed depending on labs Lab Results  Component Value Date   LDLCALC 71 01/04/2024

## 2024-01-15 NOTE — Assessment & Plan Note (Signed)
 Obesity management stable. Ozempic  plateaued for weight loss. - Consider switching to Mounjaro  for potential additional weight loss benefits if insurance approves.

## 2024-01-15 NOTE — Assessment & Plan Note (Signed)
 Chronic neck pain, limited range of motion. Previous x-ray showed arthritis. Pain not relieved by Flexeril. -Continue current management. -Consider turmeric supplement for anti-inflammatory properties.

## 2024-01-15 NOTE — Assessment & Plan Note (Signed)
 Diabetes well-controlled with current A1c levels. Ozempic  effective for glycemic control. - Send prescription for Mounjaro  to [facility] to assess insurance coverage as alternative to Ozempic  for potential weight loss benefits. - Continue Ozempic  until insurance decision is made. - Monitor A1c levels and adjust treatment as necessary. Lab Results  Component Value Date   HGBA1C <4.2 (L) 01/04/2024   HGBA1C 4.4 (L) 07/07/2023   HGBA1C 4.6 (L) 10/04/2022

## 2024-01-15 NOTE — Assessment & Plan Note (Signed)
 Deep hip pain, likely arthritis based on previous x-ray. Pain affects sleep and mobility. -Start physical therapy for hip pain. -Consider referral to orthopedist if no improvement with physical therapy.

## 2024-01-15 NOTE — Assessment & Plan Note (Signed)
 Continue taking the Ozempic  2mg  as directed Will look into changing to Mounjaro  to improve weight management and keep sugars low Lab Results  Component Value Date   HGBA1C <4.2 (L) 01/04/2024   HGBA1C 4.4 (L) 07/07/2023   HGBA1C 4.6 (L) 10/04/2022

## 2024-01-15 NOTE — Assessment & Plan Note (Signed)
 Chronic insomnia well-managed with Ambien . No adverse effects reported. - Continue Ambien  as currently prescribed.

## 2024-01-17 ENCOUNTER — Other Ambulatory Visit: Payer: Self-pay | Admitting: Physician Assistant

## 2024-01-17 DIAGNOSIS — E1169 Type 2 diabetes mellitus with other specified complication: Secondary | ICD-10-CM

## 2024-01-17 MED ORDER — TIRZEPATIDE 2.5 MG/0.5ML ~~LOC~~ SOAJ: 2.5000 mg | SUBCUTANEOUS | 2 refills | Status: DC

## 2024-02-14 ENCOUNTER — Other Ambulatory Visit: Payer: Self-pay

## 2024-02-14 DIAGNOSIS — G4700 Insomnia, unspecified: Secondary | ICD-10-CM

## 2024-02-14 MED ORDER — ZOLPIDEM TARTRATE 10 MG PO TABS: ORAL_TABLET | ORAL | 0 refills | Status: DC

## 2024-03-20 ENCOUNTER — Encounter: Payer: Self-pay | Admitting: Physician Assistant

## 2024-03-25 ENCOUNTER — Other Ambulatory Visit: Payer: Self-pay | Admitting: Physician Assistant

## 2024-03-25 DIAGNOSIS — E1169 Type 2 diabetes mellitus with other specified complication: Secondary | ICD-10-CM

## 2024-03-25 MED ORDER — TIRZEPATIDE 5 MG/0.5ML ~~LOC~~ SOAJ: 5.0000 mg | SUBCUTANEOUS | 1 refills | Status: DC

## 2024-04-09 ENCOUNTER — Other Ambulatory Visit: Payer: Self-pay | Admitting: Physician Assistant

## 2024-04-09 DIAGNOSIS — G4700 Insomnia, unspecified: Secondary | ICD-10-CM

## 2024-04-09 MED ORDER — ZOLPIDEM TARTRATE 10 MG PO TABS: ORAL_TABLET | ORAL | 0 refills | Status: DC

## 2024-04-09 NOTE — Telephone Encounter (Unsigned)
 Copied from CRM #[id number]. Topic: Clinical - Medication Refill >> Apr 09, 2024  3:41 PM Amy B wrote: Medication: zolpidem  (AMBIEN ) 10 MG tablet  Has the patient contacted their pharmacy? Yes (Agent: If no, request that the patient contact the pharmacy for the refill. If patient does not wish to contact the pharmacy document the reason why and proceed with request.) (Agent: If yes, when and what did the pharmacy advise?)  This is the patient's preferred pharmacy:  [facility] [address] - [place], [place] - [address] [place] [address] Phone: [phone] Fax: [phone]  Is this the correct pharmacy for this prescription? Yes If no, delete pharmacy and type the correct one.   Has the prescription been filled recently? NO  Is the patient out of the medication? Yes  Has the patient been seen for an appointment in the last year OR does the patient have an upcoming appointment? Yes  Can we respond through MyChart? Yes  Agent: Please be advised that Rx refills may take up to 3 business days. We ask that you follow-up with your pharmacy.

## 2024-05-05 ENCOUNTER — Encounter: Payer: Self-pay | Admitting: Physician Assistant

## 2024-05-08 ENCOUNTER — Other Ambulatory Visit: Payer: Self-pay | Admitting: Family Medicine

## 2024-05-08 DIAGNOSIS — E1169 Type 2 diabetes mellitus with other specified complication: Secondary | ICD-10-CM

## 2024-05-08 MED ORDER — MOUNJARO 7.5 MG/0.5ML ~~LOC~~ SOAJ: 7.5000 mg | SUBCUTANEOUS | 0 refills | Status: DC

## 2024-05-18 ENCOUNTER — Other Ambulatory Visit: Payer: Self-pay | Admitting: Physician Assistant

## 2024-05-18 DIAGNOSIS — G4700 Insomnia, unspecified: Secondary | ICD-10-CM

## 2024-05-20 ENCOUNTER — Other Ambulatory Visit: Payer: Self-pay

## 2024-05-20 DIAGNOSIS — G4700 Insomnia, unspecified: Secondary | ICD-10-CM

## 2024-05-20 MED ORDER — ZOLPIDEM TARTRATE 10 MG PO TABS: ORAL_TABLET | ORAL | 2 refills | Status: AC

## 2024-06-03 ENCOUNTER — Other Ambulatory Visit: Payer: Self-pay

## 2024-06-03 DIAGNOSIS — E1169 Type 2 diabetes mellitus with other specified complication: Secondary | ICD-10-CM

## 2024-06-03 MED ORDER — TIRZEPATIDE 10 MG/0.5ML ~~LOC~~ SOAJ: 10.0000 mg | SUBCUTANEOUS | 0 refills | Status: DC

## 2024-06-24 ENCOUNTER — Other Ambulatory Visit: Payer: Self-pay

## 2024-06-24 DIAGNOSIS — M542 Cervicalgia: Secondary | ICD-10-CM

## 2024-06-24 MED ORDER — CYCLOBENZAPRINE HCL 10 MG PO TABS: ORAL_TABLET | ORAL | 1 refills | Status: AC

## 2024-07-01 ENCOUNTER — Other Ambulatory Visit: Payer: Self-pay

## 2024-07-01 DIAGNOSIS — E1169 Type 2 diabetes mellitus with other specified complication: Secondary | ICD-10-CM

## 2024-07-01 MED ORDER — TIRZEPATIDE 15 MG/0.5ML ~~LOC~~ SOAJ: 15.0000 mg | SUBCUTANEOUS | 1 refills | Status: AC

## 2024-07-08 ENCOUNTER — Ambulatory Visit: Admitting: Physician Assistant

## 2024-07-18 ENCOUNTER — Ambulatory Visit: Admitting: Physician Assistant
